# Patient Record
Sex: Male | Born: 1965 | Race: Black or African American | Hispanic: No | Marital: Single | Smoking: Never smoker
Health system: Southern US, Community
[De-identification: ages and names within clinical notes are randomized; demographics above are authoritative.]

## PROBLEM LIST (undated history)

## (undated) DIAGNOSIS — I10 Essential (primary) hypertension: Secondary | ICD-10-CM

## (undated) DIAGNOSIS — G4730 Sleep apnea, unspecified: Secondary | ICD-10-CM

## (undated) DIAGNOSIS — J189 Pneumonia, unspecified organism: Secondary | ICD-10-CM

## (undated) DIAGNOSIS — J9801 Acute bronchospasm: Secondary | ICD-10-CM

---

## 2017-02-20 ENCOUNTER — Emergency Department (HOSPITAL_COMMUNITY)
Admission: EM | Admit: 2017-02-20 | Discharge: 2017-02-21 | Disposition: A | Discharge status: Home / Self Care | Payer: 59 | Attending: Emergency Medicine | Admitting: Emergency Medicine

## 2017-02-20 ENCOUNTER — Emergency Department (HOSPITAL_COMMUNITY): Payer: 59

## 2017-02-20 ENCOUNTER — Encounter (HOSPITAL_COMMUNITY): Payer: Self-pay | Admitting: Emergency Medicine

## 2017-02-20 DIAGNOSIS — J209 Acute bronchitis, unspecified: Secondary | ICD-10-CM | POA: Diagnosis not present

## 2017-02-20 DIAGNOSIS — R05 Cough: Secondary | ICD-10-CM | POA: Diagnosis present

## 2017-02-20 HISTORY — DX: Pneumonia, unspecified organism: J18.9

## 2017-02-20 MED ORDER — IPRATROPIUM-ALBUTEROL 0.5-2.5 (3) MG/3ML IN SOLN
3.0000 mL | Freq: Once | RESPIRATORY_TRACT | Status: AC
  Administered 2017-02-20: 3 mL via RESPIRATORY_TRACT
  Filled 2017-02-20: qty 3

## 2017-02-20 MED ORDER — ALBUTEROL SULFATE (2.5 MG/3ML) 0.083% IN NEBU
2.5000 mg | INHALATION_SOLUTION | Freq: Once | RESPIRATORY_TRACT | Status: AC
  Administered 2017-02-20: 2.5 mg via RESPIRATORY_TRACT
  Filled 2017-02-20: qty 3

## 2017-02-20 MED ORDER — HYDROCOD POLST-CPM POLST ER 10-8 MG/5ML PO SUER
5.0000 mL | Freq: Once | ORAL | Status: AC
  Administered 2017-02-20: 5 mL via ORAL
  Filled 2017-02-20: qty 5

## 2017-02-20 MED ORDER — DEXAMETHASONE SODIUM PHOSPHATE 4 MG/ML IJ SOLN
8.0000 mg | Freq: Once | INTRAMUSCULAR | Status: AC
  Administered 2017-02-20: 8 mg via INTRAMUSCULAR
  Filled 2017-02-20: qty 2

## 2017-02-20 MED ORDER — ALBUTEROL SULFATE HFA 108 (90 BASE) MCG/ACT IN AERS
2.0000 | INHALATION_SPRAY | Freq: Once | RESPIRATORY_TRACT | Status: AC
  Administered 2017-02-20: 2 via RESPIRATORY_TRACT
  Filled 2017-02-20: qty 6.7

## 2017-02-20 NOTE — ED Triage Notes (Signed)
Pt has had a cough for a couple weeks with no relief from otc medication/ he gets sob when cough

## 2017-02-20 NOTE — ED Provider Notes (Signed)
[facility]-EMERGENCY DEPT Provider Note   CSN: [id number] Arrival date & time: 02/20/17  2107     History   Chief Complaint Chief Complaint  Patient presents with  . Cough    HPI Jeffery Cabrera is a 51 y.o. male.  Patient is a 51 year old male who presents to the emergency department with a complaint of cough and congestion.  The patient states that he has had a cough for couple of weeks. This problem is getting progressively worse. He now has interference with attempting to speak, and it interferes with activities of daily living. The cough is mostly dry. The patient states that he has had a few chills, but has not measured a temperature at home over this two-week period. He's not had any recent injury or trauma to the chest. He has not been exposed to noxious fumes that he is aware of. The patient is concerned because he states that the last time he felt this he had pneumonia. He presents now for evaluation of this issue.      Past Medical History:  Diagnosis Date  . Pneumonia     There are no active problems to display for this patient.   History reviewed. No pertinent surgical history.     Home Medications    Prior to Admission medications   Not on File    Family History History reviewed. No pertinent family history.  Social History Social History  Substance Use Topics  . Smoking status: Never Smoker  . Smokeless tobacco: Never Used  . Alcohol use No     Allergies   Patient has no known allergies.   Review of Systems Review of Systems  Constitutional: Positive for chills.  Respiratory: Positive for cough, shortness of breath and wheezing.   All other systems reviewed and are negative.    Physical Exam Updated Vital Signs BP 130/82 (BP Location: Right Arm)   Pulse 81   Temp 99.6 F (37.6 C) (Oral)   Resp 20   Ht  (1.854 m)   Wt 90.7 kg   SpO2 98%   BMI 26.39 kg/m   Physical Exam  Constitutional: He is oriented to person,  place, and time. He appears well-developed and well-nourished.  Non-toxic appearance.  HENT:  Head: Normocephalic.  Right Ear: Tympanic membrane and external ear normal.  Left Ear: Tympanic membrane and external ear normal.  Eyes: EOM and lids are normal. Pupils are equal, round, and reactive to light.  Neck: Normal range of motion. Neck supple. Carotid bruit is not present.  Cardiovascular: Normal rate, regular rhythm, normal heart sounds, intact distal pulses and normal pulses.   Pulmonary/Chest: No respiratory distress. He has wheezes. He has rhonchi.  Cough with attempted deep breathing. Difficulty completing sentences.  Abdominal: Soft. Bowel sounds are normal. There is no tenderness. There is no guarding.  Musculoskeletal: Normal range of motion.  No lower extremity edema. Neg Homan's sign.  Lymphadenopathy:       Head (right side): No submandibular adenopathy present.       Head (left side): No submandibular adenopathy present.    He has no cervical adenopathy.  Neurological: He is alert and oriented to person, place, and time. He has normal strength. No cranial nerve deficit or sensory deficit.  Skin: Skin is warm and dry.  Psychiatric: He has a normal mood and affect. His speech is normal.  Nursing note and vitals reviewed.    ED Treatments / Results  Labs (all labs ordered are listed,  but only abnormal results are displayed) Labs Reviewed - No data to display  EKG  EKG Interpretation None       Radiology No results found.  Procedures Procedures (including critical care time)  Medications Ordered in ED Medications  dexamethasone (DECADRON) injection 8 mg (not administered)  ipratropium-albuterol (DUONEB) 0.5-2.5 (3) MG/3ML nebulizer solution 3 mL (3 mLs Nebulization Given 02/20/17 2208)  albuterol (PROVENTIL HFA;VENTOLIN HFA) 108 (90 Base) MCG/ACT inhaler 2 puff (2 puffs Inhalation Provided for home use 02/20/17 2216)     Initial Impression / Assessment and  Plan / ED Course  I have reviewed the triage vital signs and the nursing notes.  Pertinent labs & imaging results that were available during my care of the patient were reviewed by me and considered in my medical decision making (see chart for details).       Final Clinical Impressions(s) / ED Diagnoses MDM Vital signs reviewed. Pulse oximetry has been 96-98% on room air during the emergency department visit. Patient initially had difficulty completing sentences because of cough. After nebulizer treatments and cough medication he seems to be able to speak mostly in complete sentences. The chest x-ray is negative for acute problem. The d-dimer is negative for acute event.  The patient will be treated for acute bronchitis. I've encouraged him to use his inhaler 2 puffs every 4 hours. He will use Decadron and doxycycline 2 times daily with food. He will use Hycodan for cough if needed. Patient to follow-up with his primary physician or return to the department if not improving. Patient is in agreement with this plan.    Final diagnoses:  Acute bronchitis, unspecified organism    New Prescriptions New Prescriptions   No medications on file     Ivery Quale, PA-C 02/21/17 0054    Bethann Berkshire, MD 02/22/17 [phone]

## 2017-02-21 LAB — D-DIMER, QUANTITATIVE (NOT AT ARMC)

## 2017-02-21 MED ORDER — DEXAMETHASONE 4 MG PO TABS: 4.0000 mg | ORAL_TABLET | Freq: Two times a day (BID) | ORAL | 0 refills | Status: DC

## 2017-02-21 MED ORDER — DOXYCYCLINE HYCLATE 100 MG PO CAPS: 100.0000 mg | ORAL_CAPSULE | Freq: Two times a day (BID) | ORAL | 0 refills | Status: DC

## 2017-02-21 MED ORDER — HYDROCODONE-HOMATROPINE 5-1.5 MG/5ML PO SYRP: 5.0000 mL | ORAL_SOLUTION | Freq: Four times a day (QID) | ORAL | 0 refills | Status: DC | PRN

## 2017-02-21 NOTE — Discharge Instructions (Signed)
Your oxygen level is 96% on room air. You have a mild change in your temperature 99.6. Your chest x-ray is negative for acute problem. A blood tests for blood clots is negative. I suspect that you have an acute bronchitis. Please use Decadron and doxycycline 2 times daily with food. Please use 2 puffs of your albuterol every 4 hours, and please use Hycodan for cough.This medication may cause drowsiness. Please do not drink, drive, or participate in activity that requires concentration while taking this medication. Please see your primary physician, or return to the emergency department if not improving.

## 2020-03-18 ENCOUNTER — Emergency Department (HOSPITAL_COMMUNITY)
Admission: EM | Admit: 2020-03-18 | Discharge: 2020-03-19 | Disposition: A | Discharge status: Home / Self Care | Payer: 59 | Attending: Emergency Medicine | Admitting: Emergency Medicine

## 2020-03-18 ENCOUNTER — Other Ambulatory Visit: Payer: Self-pay

## 2020-03-18 ENCOUNTER — Emergency Department (HOSPITAL_COMMUNITY): Payer: 59

## 2020-03-18 ENCOUNTER — Encounter (HOSPITAL_COMMUNITY): Payer: Self-pay | Admitting: *Deleted

## 2020-03-18 DIAGNOSIS — R0602 Shortness of breath: Secondary | ICD-10-CM | POA: Diagnosis present

## 2020-03-18 DIAGNOSIS — J209 Acute bronchitis, unspecified: Secondary | ICD-10-CM | POA: Insufficient documentation

## 2020-03-18 DIAGNOSIS — R030 Elevated blood-pressure reading, without diagnosis of hypertension: Secondary | ICD-10-CM | POA: Diagnosis not present

## 2020-03-18 NOTE — ED Triage Notes (Signed)
Pt c/o sob that has been getting worse over the past few weeks, pt states that he has problems when the season changes with sob, was seen last year for same and given inhaler.

## 2020-03-19 MED ORDER — PREDNISONE 50 MG PO TABS: 50.0000 mg | ORAL_TABLET | Freq: Every day | ORAL | 0 refills | Status: DC

## 2020-03-19 MED ORDER — ALBUTEROL SULFATE HFA 108 (90 BASE) MCG/ACT IN AERS
4.0000 | INHALATION_SPRAY | Freq: Once | RESPIRATORY_TRACT | Status: AC
  Administered 2020-03-19: 4 via RESPIRATORY_TRACT

## 2020-03-19 MED ORDER — ALBUTEROL SULFATE HFA 108 (90 BASE) MCG/ACT IN AERS
4.0000 | INHALATION_SPRAY | Freq: Once | RESPIRATORY_TRACT | Status: AC
  Administered 2020-03-19: 4 via RESPIRATORY_TRACT
  Filled 2020-03-19: qty 6.7

## 2020-03-19 MED ORDER — PREDNISONE 50 MG PO TABS
60.0000 mg | ORAL_TABLET | Freq: Once | ORAL | Status: AC
  Administered 2020-03-19: 60 mg via ORAL
  Filled 2020-03-19: qty 1

## 2020-03-19 MED ORDER — HYDROCODONE-ACETAMINOPHEN 5-325 MG PO TABS: 1.0000 | ORAL_TABLET | ORAL | 0 refills | Status: DC | PRN

## 2020-03-19 MED ORDER — PREDNISONE 50 MG PO TABS: 60.0000 mg | ORAL_TABLET | Freq: Once | ORAL | Status: DC

## 2020-03-19 NOTE — Discharge Instructions (Signed)
Use the inhaler 2 puffs, every 4 hours, as needed for cough or shortness of breath.  You may take a hydrocodone-acetaminophen tablet at bedtime to help suppress cough at night.  Return if symptoms are getting worse.  Your blood pressure was slightly high today.  Please have it rechecked several times over the next 1-2 weeks.  If it is persistently high, you might need to be on medication to control it.

## 2020-03-19 NOTE — ED Provider Notes (Signed)
[facility] EMERGENCY DEPARTMENT Provider Note   CSN: [id number] Arrival date & time: 03/18/20  2141   History Chief Complaint  Patient presents with  . Shortness of Breath    Jeffery Cabrera is a 54 y.o. male.  The history is provided by the patient.  Shortness of Breath He comes in with a 1 week history of shortness of breath and a nonproductive cough.  He had similar symptoms in the past, and was given an inhaler.  He has used the inhaler with slight relief.  He denies fever, chills, sweats.  He denies nausea or vomiting.  He denies arthralgias or myalgias.  There is been no change in sense of smell or taste.  He denies exposure to COVID-19, and has received the COVID-19 vaccination series.  He is a non-smoker.  Past Medical History:  Diagnosis Date  . Pneumonia     There are no problems to display for this patient.   History reviewed. No pertinent surgical history.     No family history on file.  Social History   Tobacco Use  . Smoking status: Never Smoker  . Smokeless tobacco: Never Used  Substance Use Topics  . Alcohol use: No  . Drug use: No    Home Medications Prior to Admission medications   Medication Sig Start Date End Date Taking? Authorizing Provider  dexamethasone (DECADRON) 4 MG tablet Take 1 tablet (4 mg total) by mouth 2 (two) times daily with a meal. 02/21/17   Lily Kocher, PA-C  doxycycline (VIBRAMYCIN) 100 MG capsule Take 1 capsule (100 mg total) by mouth 2 (two) times daily. 02/21/17   Lily Kocher, PA-C  HYDROcodone-homatropine [facility]) 5-1.5 MG/5ML syrup Take 5 mLs by mouth every 6 (six) hours as needed. 02/21/17   Lily Kocher, PA-C    Allergies    Patient has no known allergies.  Review of Systems   Review of Systems  Respiratory: Positive for shortness of breath.   All other systems reviewed and are negative.   Physical Exam Updated Vital Signs BP (!) 145/109   Pulse (!) 106   Temp 99.1 F (37.3 C) (Oral)   Resp 20    Ht 6\' 2"  (1.88 m)   Wt 108.9 kg   SpO2 95%   BMI 30.81 kg/m   Physical Exam Vitals and nursing note reviewed.   54 year old male, resting comfortably and in no acute distress. Vital signs are significant for mild elevation of heart rate and elevated blood pressure. Oxygen saturation is 95%, which is normal. Head is normocephalic and atraumatic. PERRLA, EOMI. Oropharynx is clear. Neck is nontender and supple without adenopathy or JVD. Back is nontender and there is no CVA tenderness. Lungs have faint expiratory wheezes without rales or rhonchi. Chest is nontender. Heart has regular rate and rhythm without murmur. Abdomen is soft, flat, nontender without masses or hepatosplenomegaly and peristalsis is normoactive. Extremities have no cyanosis or edema, full range of motion is present. Skin is warm and dry without rash. Neurologic: Mental status is normal, cranial nerves are intact, there are no motor or sensory deficits.  ED Results / Procedures / Treatments    EKG EKG Interpretation  Date/Time:  Friday Mar 18 2020 22:16:18 EDT Ventricular Rate:  101 PR Interval:    QRS Duration: 86 QT Interval:  338 QTC Calculation: 439 R Axis:   29 Text Interpretation: Sinus tachycardia Otherwise within normal limits No old tracing to compare Confirmed by Delora Fuel ([id number]) on 03/19/2020 12:36:25 AM  Radiology DG Chest 2 View  Result Date: 03/18/2020 CLINICAL DATA:  Worsening shortness of breath. EXAM: CHEST - 2 VIEW COMPARISON:  February 20, 2017 FINDINGS: The heart size and mediastinal contours are within normal limits. Both lungs are clear. The visualized skeletal structures are unremarkable. IMPRESSION: No active cardiopulmonary disease. Electronically Signed   By: Aram Candela M.D.   On: 03/18/2020 22:37    Procedures Procedures   Medications Ordered in ED Medications  albuterol (VENTOLIN HFA) 108 (90 Base) MCG/ACT inhaler 4 puff (has no administration in time range)    predniSONE (DELTASONE) tablet 60 mg (has no administration in time range)    ED Course  I have reviewed the triage vital signs and the nursing notes.  Pertinent imaging results that were available during my care of the patient were reviewed by me and considered in my medical decision making (see chart for details).  MDM Rules/Calculators/A&P Cough and dyspnea consistent with bronchitis.  Chest x-ray shows no evidence of pneumonia.  Old records are reviewed confirming ED visit April 2018 with diagnosis of bronchitis treated with doxycycline, dexamethasone, albuterol.  Will give albuterol and prednisone.  1:35 AM There was slight improvement following albuterol.  He is given an additional albuterol.  2:12 AM There was significant improvement after an additional albuterol.  However, he still will cough if he takes a deep breath.  He is discharged with prescription for prednisone and is advised to continue using the inhaler as needed.  Also given a take-home pack of hydrocodone-acetaminophen to suppress the cough at night if he is having difficulty sleeping. Recommended recheck blood pressure as an outpatient.  Final Clinical Impression(s) / ED Diagnoses Final diagnoses:  Acute bronchitis, unspecified organism  Elevated blood pressure reading without diagnosis of hypertension    Rx / DC Orders ED Discharge Orders         Ordered    predniSONE (DELTASONE) 50 MG tablet  Daily     03/19/20 0210    HYDROcodone-acetaminophen (NORCO) 5-325 MG tablet  Every 4 hours PRN     03/19/20 0210           Dione Booze, MD 03/19/20 [phone]

## 2020-03-21 MED FILL — Hydrocodone-Acetaminophen Tab 5-325 MG: ORAL | Qty: 6 | Status: AC

## 2020-03-30 ENCOUNTER — Other Ambulatory Visit: Payer: Self-pay

## 2020-03-30 ENCOUNTER — Emergency Department (HOSPITAL_COMMUNITY): Payer: 59

## 2020-03-30 ENCOUNTER — Encounter (HOSPITAL_COMMUNITY): Payer: Self-pay | Admitting: Emergency Medicine

## 2020-03-30 ENCOUNTER — Emergency Department (HOSPITAL_COMMUNITY)
Admission: EM | Admit: 2020-03-30 | Discharge: 2020-03-31 | Disposition: A | Discharge status: Home / Self Care | Payer: 59 | Attending: Emergency Medicine | Admitting: Emergency Medicine

## 2020-03-30 DIAGNOSIS — R0602 Shortness of breath: Secondary | ICD-10-CM

## 2020-03-30 DIAGNOSIS — Z20822 Contact with and (suspected) exposure to covid-19: Secondary | ICD-10-CM | POA: Insufficient documentation

## 2020-03-30 DIAGNOSIS — J9801 Acute bronchospasm: Secondary | ICD-10-CM | POA: Diagnosis not present

## 2020-03-30 DIAGNOSIS — R059 Cough, unspecified: Secondary | ICD-10-CM

## 2020-03-30 NOTE — ED Triage Notes (Signed)
Pt c/o sob that began a month ago. Pt was seen here last week and diagnosed with bronchitis, pt was given meds and pt states he has taken them like prescribed and hasnt had any relief.

## 2020-03-31 LAB — CBC WITH DIFFERENTIAL/PLATELET
Abs Immature Granulocytes: 0.05 10*3/uL (ref 0.00–0.07)
Basophils Absolute: 0 10*3/uL (ref 0.0–0.1)
Basophils Relative: 0 %
Eosinophils Absolute: 0.3 10*3/uL (ref 0.0–0.5)
Eosinophils Relative: 3 %
HCT: 43.5 % (ref 39.0–52.0)
Hemoglobin: 14.2 g/dL (ref 13.0–17.0)
Immature Granulocytes: 1 %
Lymphocytes Relative: 35 %
Lymphs Abs: 3.4 10*3/uL (ref 0.7–4.0)
MCH: 34.4 pg — ABNORMAL HIGH (ref 26.0–34.0)
MCHC: 32.6 g/dL (ref 30.0–36.0)
MCV: 105.3 fL — ABNORMAL HIGH (ref 80.0–100.0)
Monocytes Absolute: 1.1 10*3/uL — ABNORMAL HIGH (ref 0.1–1.0)
Monocytes Relative: 12 %
Neutro Abs: 4.7 10*3/uL (ref 1.7–7.7)
Neutrophils Relative %: 49 %
Platelets: 268 10*3/uL (ref 150–400)
RBC: 4.13 MIL/uL — ABNORMAL LOW (ref 4.22–5.81)
RDW: 12.5 % (ref 11.5–15.5)
WBC: 9.6 10*3/uL (ref 4.0–10.5)
nRBC: 0 % (ref 0.0–0.2)

## 2020-03-31 LAB — BRAIN NATRIURETIC PEPTIDE: B Natriuretic Peptide: 21 pg/mL (ref 0.0–100.0)

## 2020-03-31 LAB — POC SARS CORONAVIRUS 2 AG -  ED: SARS Coronavirus 2 Ag: NEGATIVE

## 2020-03-31 LAB — BASIC METABOLIC PANEL
Anion gap: 10 (ref 5–15)
BUN: 14 mg/dL (ref 6–20)
CO2: 28 mmol/L (ref 22–32)
Calcium: 8.9 mg/dL (ref 8.9–10.3)
Chloride: 103 mmol/L (ref 98–111)
Creatinine, Ser: 1.27 mg/dL — ABNORMAL HIGH (ref 0.61–1.24)
GFR calc Af Amer: >60 mL/min (ref >60)
GFR calc non Af Amer: >60 mL/min (ref >60)
Glucose, Bld: 108 mg/dL — ABNORMAL HIGH (ref 70–99)
Potassium: 3.6 mmol/L (ref 3.5–5.1)
Sodium: 141 mmol/L (ref 135–145)

## 2020-03-31 LAB — D-DIMER, QUANTITATIVE: D-Dimer, Quant: 0.28 ug/mL-FEU (ref 0.00–0.50)

## 2020-03-31 MED ORDER — DEXAMETHASONE 4 MG PO TABS
10.0000 mg | ORAL_TABLET | Freq: Once | ORAL | Status: AC
  Administered 2020-03-31: 10 mg via ORAL
  Filled 2020-03-31: qty 3

## 2020-03-31 MED ORDER — PREDNISONE 50 MG PO TABS
ORAL_TABLET | ORAL | 0 refills | Status: DC
Start: 2020-03-31 — End: 2020-05-30

## 2020-03-31 MED ORDER — ALBUTEROL SULFATE (2.5 MG/3ML) 0.083% IN NEBU
INHALATION_SOLUTION | RESPIRATORY_TRACT | Status: AC
  Filled 2020-03-31: qty 3

## 2020-03-31 MED ORDER — IPRATROPIUM BROMIDE 0.02 % IN SOLN
0.5000 mg | Freq: Once | RESPIRATORY_TRACT | Status: AC
  Administered 2020-03-31: 0.5 mg via RESPIRATORY_TRACT
  Filled 2020-03-31: qty 2.5

## 2020-03-31 MED ORDER — ALBUTEROL (5 MG/ML) CONTINUOUS INHALATION SOLN
10.0000 mg/h | INHALATION_SOLUTION | Freq: Once | RESPIRATORY_TRACT | Status: AC
  Administered 2020-03-31: 10 mg/h via RESPIRATORY_TRACT
  Filled 2020-03-31: qty 20

## 2020-03-31 MED ORDER — ALBUTEROL SULFATE (2.5 MG/3ML) 0.083% IN NEBU
5.0000 mg | INHALATION_SOLUTION | Freq: Once | RESPIRATORY_TRACT | Status: AC
  Administered 2020-03-31: 5 mg via RESPIRATORY_TRACT
  Filled 2020-03-31: qty 6

## 2020-03-31 NOTE — ED Notes (Signed)
Pt ambulated once around nurses' station. O2 sats were 94% prior to ambulation and dropped to 93% during ambulation. Pt stated he was winded with ambulation and unable to speak in a full sentence.

## 2020-03-31 NOTE — ED Provider Notes (Signed)
[facility] EMERGENCY DEPARTMENT Provider Note   CSN: [id number] Arrival date & time: 03/30/20  2008     History Chief Complaint  Patient presents with  . Shortness of Breath    Jeffery Cabrera is a 54 y.o. male.  The history is provided by the patient.  Shortness of Breath Severity:  Moderate Onset quality:  Gradual Duration:  1 month Timing:  Intermittent Progression:  Worsening Chronicity:  New Relieved by:  Nothing Worsened by:  Nothing Associated symptoms: cough and wheezing   Associated symptoms: no abdominal pain, no chest pain, no diaphoresis, no fever, no hemoptysis and no vomiting   Risk factors: no tobacco use   Patient is an otherwise healthy 54 year old who presents with cough and shortness of breath.  He reports this is been ongoing for up to a month.  He reports frequent episodes of dry cough particularly when he speaks.  No hemoptysis.  No active chest pain.  No fevers or night sweats.  No weight loss.  No dyspnea on exertion.  No lower extremity edema. Seen recently for similar symptoms with no significant improvement. No history of CAD/VTE     Past Medical History:  Diagnosis Date  . Pneumonia     There are no problems to display for this patient.   History reviewed. No pertinent surgical history.     History reviewed. No pertinent family history.  Social History   Tobacco Use  . Smoking status: Never Smoker  . Smokeless tobacco: Never Used  Substance Use Topics  . Alcohol use: No  . Drug use: No    Home Medications Prior to Admission medications   Not on File    Allergies    Patient has no known allergies.  Review of Systems   Review of Systems  Constitutional: Negative for diaphoresis, fever and unexpected weight change.  HENT: Negative for postnasal drip.   Respiratory: Positive for cough, shortness of breath and wheezing. Negative for hemoptysis.   Cardiovascular: Negative for chest pain and leg swelling.    Gastrointestinal: Negative for abdominal pain and vomiting.  All other systems reviewed and are negative.   Physical Exam Updated Vital Signs BP (!) 138/98   Pulse 89   Temp 98.9 F (37.2 C) (Oral)   Resp 18   Ht 1.829 m (6')   Wt 108.9 kg   SpO2 96%   BMI 32.55 kg/m   Physical Exam CONSTITUTIONAL: Well developed/well nourished HEAD: Normocephalic/atraumatic EYES: EOMI/PERRL ENMT: Mucous membranes moist, uvula midline without erythema or exudates. NECK: supple no meningeal signs SPINE/BACK:entire spine nontender CV: S1/S2 noted, no murmurs/rubs/gallops noted LUNGS: Scattered wheezing bilaterally, coughs frequent during exam, no apparent distress ABDOMEN: soft, nontender, no rebound or guarding, bowel sounds noted throughout abdomen GU:no cva tenderness NEURO: Pt is awake/alert/appropriate, moves all extremitiesx4.  No facial droop.  EXTREMITIES: pulses normal/equal, full ROM, no calf tenderness or edema SKIN: warm, color normal PSYCH: no abnormalities of mood noted, alert and oriented to situation  ED Results / Procedures / Treatments   Labs (all labs ordered are listed, but only abnormal results are displayed) Labs Reviewed  BASIC METABOLIC PANEL - Abnormal; Notable for the following components:      Result Value   Glucose, Bld 108 (*)    Creatinine, Ser 1.27 (*)    All other components within normal limits  CBC WITH DIFFERENTIAL/PLATELET - Abnormal; Notable for the following components:   RBC 4.13 (*)    MCV 105.3 (*)    Vital Sight Pc  34.4 (*)    Monocytes Absolute 1.1 (*)    All other components within normal limits  BRAIN NATRIURETIC PEPTIDE  D-DIMER, QUANTITATIVE (NOT AT [facility])  POC SARS CORONAVIRUS 2 AG -  ED    EKG EKG Interpretation  Date/Time:  Wednesday March 30 2020 20:25:18 EDT Ventricular Rate:  102 PR Interval:  138 QRS Duration: 80 QT Interval:  326 QTC Calculation: 424 R Axis:   56 Text Interpretation: Sinus tachycardia with Premature atrial  complexes Nonspecific T wave abnormality Abnormal ECG No significant change since last tracing Confirmed by Ripley Fraise [phone]) on 03/30/2020 11:51:51 PM   Radiology DG Chest Port 1 View  Result Date: 03/30/2020 CLINICAL DATA:  Shortness of breath for 1 month EXAM: PORTABLE CHEST 1 VIEW COMPARISON:  03/18/2020 FINDINGS: Cardiac shadow is within normal limits. The lungs are well aerated bilaterally. No focal infiltrate or sizable effusion is seen. No bony abnormality is noted. IMPRESSION: No active disease. Electronically Signed   By: Inez Catalina M.D.   On: 03/30/2020 23:33    Procedures .Critical Care Performed by: Ripley Fraise, MD Authorized by: Ripley Fraise, MD   Critical care provider statement:    Critical care time (minutes):  50   Critical care start time:  03/31/2020 12:10 AM   Critical care end time:  03/31/2020 1:00 AM   Critical care time was exclusive of:  Separately billable procedures and treating other patients   Critical care was necessary to Cabrera or prevent imminent or life-threatening deterioration of the following conditions:  Respiratory failure   Critical care was time spent personally by me on the following activities:  Ordering and performing treatments and interventions, ordering and review of laboratory studies, ordering and review of radiographic studies, pulse oximetry, re-evaluation of patient's condition, evaluation of patient's response to treatment, examination of patient and development of treatment plan with patient or surrogate   I assumed direction of critical care for this patient from another provider in my specialty: no        Medications Ordered in ED Medications  dexamethasone (DECADRON) tablet 10 mg (10 mg Oral Given 03/31/20 0015)  albuterol (PROVENTIL) (2.5 MG/3ML) 0.083% nebulizer solution 5 mg ( Nebulization Not Given 03/31/20 0120)  ipratropium (ATROVENT) nebulizer solution 0.5 mg (0.5 mg Nebulization Given 03/31/20 0102)  albuterol  (PROVENTIL,VENTOLIN) solution continuous neb (10 mg/hr Nebulization Given 03/31/20 0220)    ED Course  I have reviewed the triage vital signs and the nursing notes.  Pertinent labs & imaging results that were available during my care of the patient were reviewed by me and considered in my medical decision making (see chart for details).    MDM Rules/Calculators/A&P                        This patient presents to the ED for concern of cough and shortness of breath, this involves an extensive number of treatment options, and is a complaint that carries with it a high risk of complications and morbidity.  The differential diagnosis includes pneumonia, COVID-19, congestive heart failure, pulmonary embolism, acute coronary syndrome   Lab Tests:   I Ordered, reviewed, and interpreted labs, which included D-dimer, brain natretic peptide, metabolic panel, complete blood count  Medicines ordered:   I ordered medication albuterol, Atrovent, Decadron for wheezing  Imaging Studies ordered:   I ordered imaging studies which included chest x-ray   I independently visualized and interpreted imaging which showed no acute findings  Additional history  obtained:    Previous records obtained and reviewed   Reevaluation:  After the interventions stated above, I reevaluated the patient and found patient is improved  Critical Interventions:  . Hour-long albuterol nebulized therapy  3:32 AM Patient presented with cough and wheezing have been ongoing for quite some time.  He reports recent acute worsening.  He is unsure what is causing this as he is a non-smoker and has never had it before.  He suspects it may be occupational related.  He had wheezing on initial examination and was tachypneic on ambulation.  He was given multiple treatments including hour-long nebulized therapy.  Overall patient is improved.  Room air pulse ox is 97%.  His vitals are otherwise appropriate. BP 119/63 (BP  Location: Right Arm)   Pulse 98   Temp 98.9 F (37.2 C) (Oral)   Resp 18   Ht 1.829 m (6')   Wt 108.9 kg   SpO2 97%   BMI 32.55 kg/m  Extensive evaluation emergency department has ruled out a PE, no signs of CHF, no signs of pneumonia. Overall I feel he is appropriate for discharge home.  He will be placed back on a short burst of steroids.  He will be referred to a primary care physician as well as pulmonology  Jeffery Cabrera was evaluated in Emergency Department on 03/31/2020 for the symptoms described in the history of present illness. He was evaluated in the context of the global COVID-19 pandemic, which necessitated consideration that the patient might be at risk for infection with the SARS-CoV-2 virus that causes COVID-19. Institutional protocols and algorithms that pertain to the evaluation of patients at risk for COVID-19 are in a state of rapid change based on information released by regulatory bodies including the CDC and federal and state organizations. These policies and algorithms were followed during the patient's care in the ED.  Final Clinical Impression(s) / ED Diagnoses Final diagnoses:  Cough  Acute bronchospasm    Rx / DC Orders ED Discharge Orders         Ordered    predniSONE (DELTASONE) 50 MG tablet     03/31/20 0310           Zadie Rhine, MD 03/31/20 [phone]

## 2020-05-30 ENCOUNTER — Ambulatory Visit (INDEPENDENT_AMBULATORY_CARE_PROVIDER_SITE_OTHER): Payer: 59 | Admitting: Pulmonary Disease

## 2020-05-30 ENCOUNTER — Other Ambulatory Visit: Payer: Self-pay

## 2020-05-30 ENCOUNTER — Encounter: Payer: Self-pay | Admitting: Pulmonary Disease

## 2020-05-30 VITALS — BP 140/94 | HR 80 | Temp 98.4°F | Ht 74.0 in | Wt 234.4 lb

## 2020-05-30 DIAGNOSIS — J4520 Mild intermittent asthma, uncomplicated: Secondary | ICD-10-CM

## 2020-05-30 DIAGNOSIS — J45909 Unspecified asthma, uncomplicated: Secondary | ICD-10-CM | POA: Insufficient documentation

## 2020-05-30 MED ORDER — ALBUTEROL SULFATE HFA 108 (90 BASE) MCG/ACT IN AERS
2.0000 | INHALATION_SPRAY | Freq: Four times a day (QID) | RESPIRATORY_TRACT | 3 refills | Status: DC | PRN
Start: 2020-05-30 — End: 2022-09-24

## 2020-05-30 MED ORDER — BREO ELLIPTA 100-25 MCG/INH IN AEPB
1.0000 | INHALATION_SPRAY | Freq: Every day | RESPIRATORY_TRACT | 0 refills | Status: DC
Start: 2020-05-30 — End: 2022-09-24

## 2020-05-30 NOTE — Patient Instructions (Signed)
Your intermittent wheezing may be related to asthma or reflux.  Schedule spirometry-pre and post.  Trial of Breo 100 -use once daily for 3 to 4 days if you start having symptoms, rinse mouth after use Use albuterol 2 puffs every 6 hours as needed when you have wheezing  If you have another flare, call us and we will schedule allergy testing and start treatment for reflux

## 2020-05-30 NOTE — Assessment & Plan Note (Signed)
intermittent wheezing may be related to RADS or reflux.  He does have some seasonal variation so could be allergy related although he does not seem to have the typical eyes or nasal symptoms Does not seem to be exercise-induced since he can perform a full workout including cardio without any problems when he is well.  Detailed environmental history does not elicit any obvious trigger  Schedule spirometry-pre and post.  Trial of Breo 100 -use once daily for 3 to 4 days if you start having symptoms, rinse mouth after use (Symbicort would be ideal for such as needed use but we only have samples of Breo today ) Use albuterol 2 puffs every 6 hours as needed when you have wheezing  If you have another flare, call us and we will pursue RAST/allergy testing and start empiric treatment for reflux

## 2020-05-30 NOTE — Progress Notes (Signed)
Subjective:    Patient ID: Jeffery Cabrera, male    DOB: 20-Feb-1966, 54 y.o.   MRN: [id number]  HPI  Chief Complaint  Patient presents with  . Consult    patient has had a dry cough over the last couple months notices it more when he is talking. He feels like he has to clear his throat all the time.  Was seen in the ED on 6/2 for the same   54 year old never smoker presents for evaluation of intermittent wheezing and bronchospasm. 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  He reports seasonal issues in the spring and fall when his throat would be affected and he would lose his voice although did not have any typical eye or nasal symptoms.  Has never taken allergy medications.  He went to an urgent care few years ago and was given an albuterol inhaler  He had an ED visit 5/21 where he was treated for "bronchitis" with nebulizer and given prednisone taper and hydrocodone tablets for cough.  He had another visit on 6/2 where he required a continuous nebulizer with albuterol and another Atrovent neb and was given dexamethasone 10 mg and discharged with another prednisone taper.  Chest x-ray on both these occasions which I personally reviewed did not show infiltrates or effusions, Covid testing was negative  Since then he has been well.  He works out daily and is able to [employer] and do cardio without any problems.  He denies significant reflux and does not need over-the-counter medication for this.  He denies nasal stuffiness or itchy eyes  Labs reviewed normal CBC, AEC 300, creatinine 1.2    Past Medical History:  Diagnosis Date  . Pneumonia    History reviewed. No pertinent surgical history.  No Known Allergies  Social History   Socioeconomic History  . Marital status: Married    Spouse name: Not on file  . Number of children: Not on file  . Years of education: Not on file  . Highest education level: Not on file   Occupational History  . Not on file  Tobacco Use  . Smoking status: Never Smoker  . Smokeless tobacco: Never Used  Vaping Use  . Vaping Use: Never used  Substance and Sexual Activity  . Alcohol use: No  . Drug use: No  . Sexual activity: Not on file  Other Topics Concern  . Not on file  Social History Narrative  . Not on file   Social Determinants of Health   Financial Resource Strain:   . Difficulty of Paying Living Expenses:   Food Insecurity:   . Worried About Programme researcher, broadcasting/film/video in the Last Year:   . Barista in the Last Year:   Transportation Needs:   . Freight forwarder (Medical):   Marland Kitchen Lack of Transportation (Non-Medical):   Physical Activity:   . Days of Exercise per Week:   . Minutes of Exercise per Session:   Stress:   . Feeling of Stress :   Social Connections:   . Frequency of Communication with Friends and Family:   . Frequency of Social Gatherings with Friends and Family:   . Attends Religious Services:   . Active Member of Clubs or Organizations:   . Attends Banker Meetings:   Marland Kitchen Marital Status:   Intimate Partner Violence:   . Fear of Current or Ex-Partner:   . Emotionally  Abused:   Marland Kitchen Physically Abused:   . Sexually Abused:      History reviewed. No pertinent family history.    Review of Systems   Constitutional: negative for anorexia, fevers and sweats  Eyes: negative for irritation, redness and visual disturbance  Ears, nose, mouth, throat, and face: negative for earaches, epistaxis, nasal congestion and sore throat  Respiratory: negative for  dyspnea on exertion, sputum Cardiovascular: negative for chest pain, dyspnea, lower extremity edema, orthopnea, palpitations and syncope  Gastrointestinal: negative for abdominal pain, constipation, diarrhea, melena, nausea and vomiting  Genitourinary:negative for dysuria, frequency and hematuria  Hematologic/lymphatic: negative for bleeding, easy bruising and lymphadenopathy   Musculoskeletal:negative for arthralgias, muscle weakness and stiff joints  Neurological: negative for coordination problems, gait problems, headaches and weakness  Endocrine: negative for diabetic symptoms including polydipsia, polyuria and weight loss     Objective:   Physical Exam  Gen. Pleasant, well-nourished, well built in no distress, normal affect ENT - no pallor,icterus, no post nasal drip Neck: No JVD, no thyromegaly, no carotid bruits Lungs: no use of accessory muscles, no dullness to percussion, clear without rales or rhonchi  Cardiovascular: Rhythm regular, heart sounds  normal, no murmurs or gallops, no peripheral edema Abdomen: soft and non-tender, no hepatosplenomegaly, BS normal. Musculoskeletal: No deformities, no cyanosis or clubbing Neuro:  alert, non focal       Assessment & Plan:

## 2020-08-08 ENCOUNTER — Ambulatory Visit (INDEPENDENT_AMBULATORY_CARE_PROVIDER_SITE_OTHER): Payer: 59 | Admitting: Urology

## 2020-08-08 ENCOUNTER — Encounter: Payer: Self-pay | Admitting: Urology

## 2020-08-08 ENCOUNTER — Other Ambulatory Visit: Payer: Self-pay

## 2020-08-08 VITALS — BP 144/88 | HR 98 | Temp 99.0°F | Ht 74.0 in | Wt 234.0 lb

## 2020-08-08 DIAGNOSIS — N486 Induration penis plastica: Secondary | ICD-10-CM | POA: Diagnosis not present

## 2020-08-08 MED ORDER — PENTOXIFYLLINE ER 400 MG PO TBCR: 400.0000 mg | EXTENDED_RELEASE_TABLET | Freq: Two times a day (BID) | ORAL | 2 refills | Status: DC

## 2020-08-08 NOTE — Patient Instructions (Signed)
Collagenase injection (Dupuytren's Contracture/Peyronie's Disease) What is this medicine? COLLAGENASE (kohl LAH jen ace) is used to treat Dupuytren's contracture. This medicine may help straighten a bent finger by breaking up hard tissue. It is also used for Peyronie's disease by breaking up the hard tissue plaque that causes the curvature in the penis. This medicine may be used for other purposes; ask your health care provider or pharmacist if you have questions. COMMON BRAND NAME(S): Xiaflex What should I tell my health care provider before I take this medicine? They need to know if you have any of these conditions:  hemophilia  low platelet counts  take medicines that treat or prevent blood clots  an unusual or allergic reaction to collagenase, other medicines, foods, dyes, or preservatives  pregnant or trying to get pregnant  breast-feeding How should I use this medicine? This medicine is for injection into the hand or penis. It is given by a health care professional in a hospital or clinic setting. A special MedGuide will be given to you by the pharmacist with each prescription and refill. Be sure to read this information carefully each time. Talk to your pediatrician regarding the use of this medicine in children. Special care may be needed. Overdosage: If you think you have taken too much of this medicine contact a poison control center or emergency room at once. NOTE: This medicine is only for you. Do not share this medicine with others. What if I miss a dose? It is important not to miss your dose. Call your doctor or health care professional if you are unable to keep an appointment. What may interact with this medicine?  aspirin and aspirin-like medicines  certain medicines that treat or prevent blood clots like warfarin, enoxaparin, and dalteparin This list may not describe all possible interactions. Give your health care provider a list of all the medicines, herbs,  non-prescription drugs, or dietary supplements you use. Also tell them if you smoke, drink alcohol, or use illegal drugs. Some items may interact with your medicine. What should I watch for while using this medicine? Your condition will be monitored carefully while you are receiving this medicine. If being treated for Dupuytren's contracture, return to your healthcare provider the day after your hand is injected. In the meantime, do not flex or extend the fingers of your hand that was injected. Do not touch your finger that was injected, and elevate your hand until bedtime. Do not perform activity with the injected hand until you are told that it is OK. Follow any instructions about wearing a splint or performing finger exercises. Also, call your healthcare provider if you get increasing redness or swelling in the hand, if you have numbness or tingling in the treated finger, or if you have trouble bending the finger after the swelling goes down. If being treated for Peyronie's disease, you will need to return to your healthcare provider for a manual procedure that will stretch and help straighten your penis. Also, your healthcare provider will show you how to gently stretch your penis at home. Do not resume sexual activity until you are told that it is okay. Follow instructions on when to return for follow-up visits. Immediately call your doctor if you have trouble stretching or straightening your penis, or if you have pain or other concerns. Immediately call your healthcare provider if you get a fever or chills. What side effects may I notice from receiving this medicine? Side effects that you should report to your doctor or health   care professional as soon as possible:  allergic reactions like skin rash, itching or hives, swelling of the face, lips, or tongue  breathing problems  chest pain or palpitations  pain in your penis  pain when urinating  red or dark-brown urine  sudden loss of the  ability to maintain an erection  swelling of the injected hand  unusual swelling or bruising of the penis Side effects that usually do not require medical attention (report to your doctor or health care professional if they continue or are bothersome):  irritation at site where injected  pain at site where injected  unusual bleeding or bruising This list may not describe all possible side effects. Call your doctor for medical advice about side effects. You may report side effects to FDA at 1-800-FDA-1088. Where should I keep my medicine? This drug is given in a hospital or clinic and will not be stored at home. NOTE: This sheet is a summary. It may not cover all possible information. If you have questions about this medicine, talk to your doctor, pharmacist, or health care provider.  2020 Elsevier/Gold Standard (2015-11-17 09:34:13)  

## 2020-08-08 NOTE — Progress Notes (Signed)
Urological Symptom Review  Patient is experiencing the following symptoms: Get up at night to urinate Erection problems (male only) Penile pain (male only)    Review of Systems  Gastrointestinal (upper)  : Negative for upper GI symptoms  Gastrointestinal (lower) : Negative for lower GI symptoms  Constitutional : Negative for symptoms  Skin: Negative for skin symptoms  Eyes: Negative for eye symptoms  Ear/Nose/Throat : Negative for Ear/Nose/Throat symptoms  Hematologic/Lymphatic: Negative for Hematologic/Lymphatic symptoms  Cardiovascular : Negative for cardiovascular symptoms  Respiratory : Negative for respiratory symptoms  Endocrine: Negative for endocrine symptoms  Musculoskeletal: Negative for musculoskeletal symptoms  Neurological: Negative for neurological symptoms  Psychologic: Negative for psychiatric symptoms
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
## 2020-11-07 ENCOUNTER — Ambulatory Visit: Payer: 59 | Admitting: Urology

## 2020-12-26 ENCOUNTER — Encounter: Payer: Self-pay | Admitting: Urology

## 2020-12-26 ENCOUNTER — Ambulatory Visit (INDEPENDENT_AMBULATORY_CARE_PROVIDER_SITE_OTHER): Payer: 59 | Admitting: Urology

## 2020-12-26 ENCOUNTER — Other Ambulatory Visit: Payer: Self-pay

## 2020-12-26 VITALS — BP 159/97 | HR 97 | Temp 98.5°F | Ht 74.0 in | Wt 240.0 lb

## 2020-12-26 DIAGNOSIS — N486 Induration penis plastica: Secondary | ICD-10-CM | POA: Diagnosis not present

## 2020-12-26 LAB — URINALYSIS, ROUTINE W REFLEX MICROSCOPIC
Bilirubin, UA: NEGATIVE
Glucose, UA: NEGATIVE
Leukocytes,UA: NEGATIVE
Nitrite, UA: NEGATIVE
Protein,UA: NEGATIVE
Specific Gravity, UA: 1.025 (ref 1.005–1.030)
Urobilinogen, Ur: 0.2 mg/dL (ref 0.2–1.0)
pH, UA: 5 (ref 5.0–7.5)

## 2020-12-26 LAB — MICROSCOPIC EXAMINATION
Bacteria, UA: NONE SEEN
Epithelial Cells (non renal): NONE SEEN /hpf (ref 0–10)
Renal Epithel, UA: NONE SEEN /hpf
WBC, UA: NONE SEEN /hpf (ref 0–5)

## 2020-12-26 NOTE — Progress Notes (Signed)
Urological Symptom Review  Patient is experiencing the following symptoms: Get up at night to urinate Erection problems (male only)   Review of Systems  Gastrointestinal (upper)  : Negative for upper GI symptoms  Gastrointestinal (lower) : Negative for lower GI symptoms  Constitutional : Negative for symptoms  Skin: Negative for skin symptoms  Eyes: Negative for eye symptoms  Ear/Nose/Throat : Negative for Ear/Nose/Throat symptoms  Hematologic/Lymphatic: Negative for Hematologic/Lymphatic symptoms  Cardiovascular : Negative for cardiovascular symptoms  Respiratory : Negative for respiratory symptoms  Endocrine: Negative for endocrine symptoms  Musculoskeletal: Negative for musculoskeletal symptoms  Neurological: Negative for neurological symptoms  Psychologic: Negative for psychiatric symptoms 

## 2020-12-26 NOTE — Patient Instructions (Signed)
Collagenase injection (Dupuytren's Contracture/Peyronie's Disease) What is this medicine? COLLAGENASE (kohl LAH jen ace) is used to treat Dupuytren's contracture. This medicine may help straighten a bent finger by breaking up hard tissue. It is also used for Peyronie's disease by breaking up the hard tissue plaque that causes the curvature in the penis. This medicine may be used for other purposes; ask your health care provider or pharmacist if you have questions. COMMON BRAND NAME(S): Xiaflex What should I tell my health care provider before I take this medicine? They need to know if you have any of these conditions:  hemophilia  low platelet counts  take medicines that treat or prevent blood clots  an unusual or allergic reaction to collagenase, other medicines, foods, dyes, or preservatives  pregnant or trying to get pregnant  breast-feeding How should I use this medicine? This medicine is for injection into the hand or penis. It is given by a health care professional in a hospital or clinic setting. A special MedGuide will be given to you by the pharmacist with each prescription and refill. Be sure to read this information carefully each time. Talk to your pediatrician regarding the use of this medicine in children. Special care may be needed. Overdosage: If you think you have taken too much of this medicine contact a poison control center or emergency room at once. NOTE: This medicine is only for you. Do not share this medicine with others. What if I miss a dose? It is important not to miss your dose. Call your doctor or health care professional if you are unable to keep an appointment. What may interact with this medicine?  aspirin and aspirin-like medicines  certain medicines that treat or prevent blood clots like warfarin, enoxaparin, and dalteparin This list may not describe all possible interactions. Give your health care provider a list of all the medicines, herbs,  non-prescription drugs, or dietary supplements you use. Also tell them if you smoke, drink alcohol, or use illegal drugs. Some items may interact with your medicine. What should I watch for while using this medicine? Your condition will be monitored carefully while you are receiving this medicine. If being treated for Dupuytren's contracture, return to your healthcare provider the day after your hand is injected. In the meantime, do not flex or extend the fingers of your hand that was injected. Do not touch your finger that was injected, and elevate your hand until bedtime. Do not perform activity with the injected hand until you are told that it is OK. Follow any instructions about wearing a splint or performing finger exercises. Also, call your healthcare provider if you get increasing redness or swelling in the hand, if you have numbness or tingling in the treated finger, or if you have trouble bending the finger after the swelling goes down. If being treated for Peyronie's disease, you will need to return to your healthcare provider for a manual procedure that will stretch and help straighten your penis. Also, your healthcare provider will show you how to gently stretch your penis at home. Do not resume sexual activity until you are told that it is okay. Follow instructions on when to return for follow-up visits. Immediately call your doctor if you have trouble stretching or straightening your penis, or if you have pain or other concerns. Immediately call your healthcare provider if you get a fever or chills. What side effects may I notice from receiving this medicine? Side effects that you should report to your doctor or health   care professional as soon as possible:  allergic reactions like skin rash, itching or hives, swelling of the face, lips, or tongue  chest pain or palpitations  infection (fever, chills, increasing redness or swelling at treated site)  numbness or tingling in a treated  hand  popping sound or sensation in an erect penis  sudden back pain or trouble walking  sudden loss of the ability to maintain an erection  trouble breathing  trouble urinating or blood in the urine  unusual pain, swelling, or bruising of the penis Side effects that usually do not require medical attention (report to your doctor or health care professional if they continue or are bothersome):  bleeding or bruising at site where injected  irritation at site where injected  mild pain or swelling at site where injected This list may not describe all possible side effects. Call your doctor for medical advice about side effects. You may report side effects to FDA at 1-800-FDA-1088. Where should I keep my medicine? This drug is given in a hospital or clinic and will not be stored at home. NOTE: This sheet is a summary. It may not cover all possible information. If you have questions about this medicine, talk to your doctor, pharmacist, or health care provider.  2021 Elsevier/Gold Standard (2020-02-12 17:03:04)  

## 2020-12-26 NOTE — Progress Notes (Signed)
12/26/2020 2:52 PM   Lewanda Rife 06/04/1966 [id number]  Referring provider: No referring provider defined for this encounter.  Peyronies disease  HPI: Jeffery Cabrera is a 55yo here for followup for peyronies disease. He notes no change in curvature over the past 6 months. No pain with erections. He gets a semifirm erection past the curvature. No other complaints today   PMH: Past Medical History:  Diagnosis Date  . Pneumonia     Surgical History: No past surgical history on file.  Home Medications:  Allergies as of 12/26/2020   No Known Allergies     Medication List       Accurate as of December 26, 2020  2:52 PM. If you have any questions, ask your nurse or doctor.        albuterol 108 (90 Base) MCG/ACT inhaler Commonly known as: VENTOLIN HFA Inhale 2 puffs into the lungs every 6 (six) hours as needed for wheezing or shortness of breath.   Breo Ellipta 100-25 MCG/INH Aepb Generic drug: fluticasone furoate-vilanterol Inhale 1 puff into the lungs daily.   pentoxifylline 400 MG CR tablet Commonly known as: TRENTAL Take 1 tablet (400 mg total) by mouth in the morning and at bedtime.       Allergies: No Known Allergies  Family History: No family history on file.  Social History:  reports that he has never smoked. He has never used smokeless tobacco. He reports that he does not drink alcohol and does not use drugs.  ROS: All other review of systems were reviewed and are negative except what is noted above in HPI  Physical Exam: BP (!) 159/97   Pulse 97   Temp 98.5 F (36.9 C)   Ht 6\' 2"  (1.88 m)   Wt 240 lb (108.9 kg)   BMI 30.81 kg/m   Constitutional:  Alert and oriented, No acute distress. HEENT: [place] AT, moist mucus membranes.  Trachea midline, no masses. Cardiovascular: No clubbing, cyanosis, or edema. Respiratory: Normal respiratory effort, no increased work of breathing. GI: Abdomen is soft, nontender, nondistended, no abdominal masses GU:  No CVA tenderness.  Lymph: No cervical or inguinal lymphadenopathy. Skin: No rashes, bruises or suspicious lesions. Neurologic: Grossly intact, no focal deficits, moving all 4 extremities. Psychiatric: Normal mood and affect.  Laboratory Data: Lab Results  Component Value Date   WBC 9.6 03/31/2020   HGB 14.2 03/31/2020   HCT 43.5 03/31/2020   MCV 105.3 (H) 03/31/2020   PLT 268 03/31/2020    Lab Results  Component Value Date   CREATININE 1.27 (H) 03/31/2020    No results found for: PSA  No results found for: TESTOSTERONE  No results found for: HGBA1C  Urinalysis No results found for: COLORURINE, APPEARANCEUR, LABSPEC, PHURINE, GLUCOSEU, HGBUR, BILIRUBINUR, KETONESUR, PROTEINUR, UROBILINOGEN, NITRITE, LEUKOCYTESUR  No results found for: LABMICR, WBCUA, RBCUA, LABEPIT, MUCUS, BACTERIA  Pertinent Imaging:  No results found for this or any previous visit.  No results found for this or any previous visit.  No results found for this or any previous visit.  No results found for this or any previous visit.  No results found for this or any previous visit.  No results found for this or any previous visit.  No results found for this or any previous visit.  No results found for this or any previous visit.   Assessment & Plan:    1. Peyronie disease We discussed the management of peyronies disease including medical therapy, penile plication, verapamil therapy and  xiaflex therapy. After discussed the options the patient elects for xiaflex therapy. - Urinalysis, Routine w reflex microscopic   No follow-ups on file.  Wilkie Aye, MD  [facility] [place]

## 2021-07-19 IMAGING — DX DG CHEST 1V PORT
1 series · 1 of 1 positions shown · non-contrast
Comparison: 03/18/2020

CLINICAL DATA: Shortness of breath for 1 month

EXAM:
PORTABLE CHEST 1 VIEW

[chest ap grid]
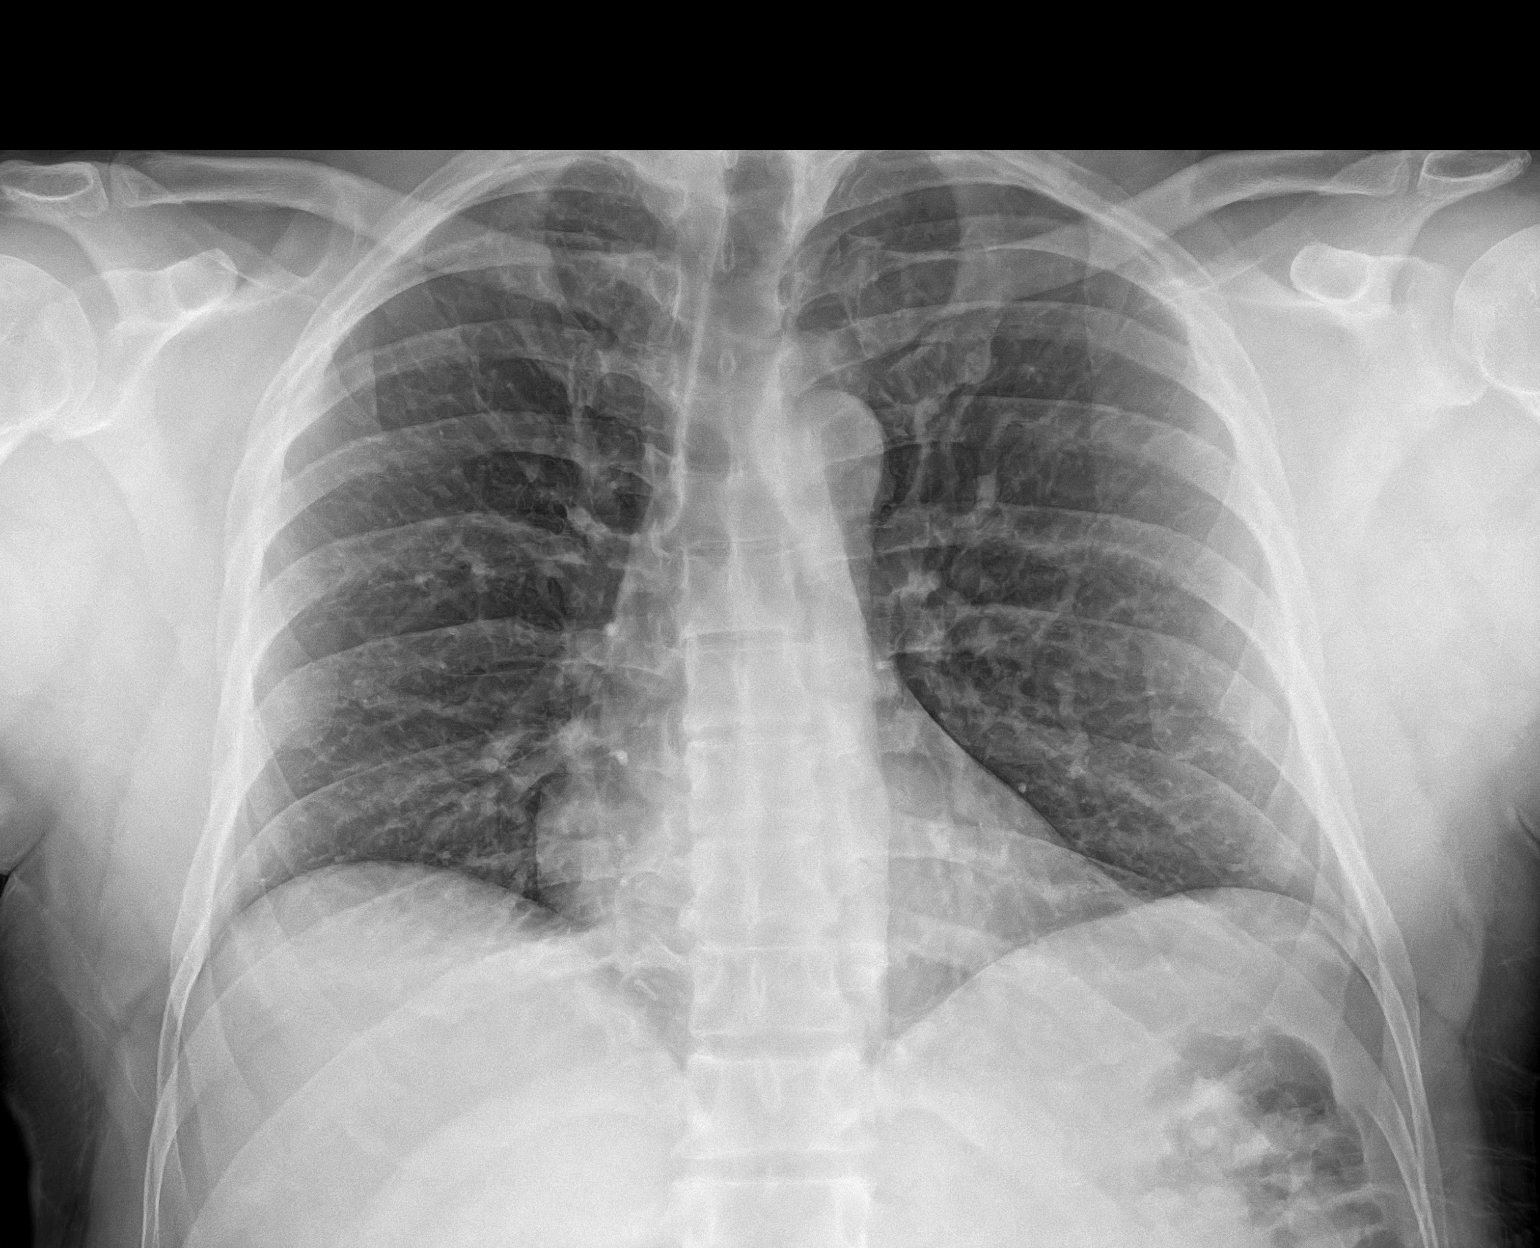

[1 of 1 positions shown; findings below may reference images not displayed]

FINDINGS: Cardiac shadow is within normal limits. The lungs are well aerated
bilaterally. No focal infiltrate or sizable effusion is seen. No
bony abnormality is noted.
IMPRESSION: No active disease.

## 2021-10-11 ENCOUNTER — Other Ambulatory Visit: Payer: Self-pay

## 2021-10-11 ENCOUNTER — Emergency Department (HOSPITAL_COMMUNITY)
Admission: EM | Admit: 2021-10-11 | Discharge: 2021-10-11 | Disposition: A | Discharge status: Home / Self Care | Payer: 59 | Attending: Emergency Medicine | Admitting: Emergency Medicine

## 2021-10-11 ENCOUNTER — Encounter (HOSPITAL_COMMUNITY): Payer: Self-pay | Admitting: *Deleted

## 2021-10-11 DIAGNOSIS — R000 Tachycardia, unspecified: Secondary | ICD-10-CM | POA: Insufficient documentation

## 2021-10-11 DIAGNOSIS — R059 Cough, unspecified: Secondary | ICD-10-CM | POA: Diagnosis present

## 2021-10-11 DIAGNOSIS — Z79899 Other long term (current) drug therapy: Secondary | ICD-10-CM | POA: Diagnosis not present

## 2021-10-11 DIAGNOSIS — J101 Influenza due to other identified influenza virus with other respiratory manifestations: Secondary | ICD-10-CM | POA: Insufficient documentation

## 2021-10-11 DIAGNOSIS — J45909 Unspecified asthma, uncomplicated: Secondary | ICD-10-CM | POA: Insufficient documentation

## 2021-10-11 DIAGNOSIS — Z20822 Contact with and (suspected) exposure to covid-19: Secondary | ICD-10-CM | POA: Insufficient documentation

## 2021-10-11 LAB — RESP PANEL BY RT-PCR (FLU A&B, COVID) ARPGX2
Influenza A by PCR: POSITIVE — AB
Influenza B by PCR: NEGATIVE
SARS Coronavirus 2 by RT PCR: NEGATIVE

## 2021-10-11 MED ORDER — OSELTAMIVIR PHOSPHATE 75 MG PO CAPS
75.0000 mg | ORAL_CAPSULE | Freq: Two times a day (BID) | ORAL | 0 refills | Status: DC
Start: 2021-10-11 — End: 2022-09-24

## 2021-10-11 MED ORDER — ACETAMINOPHEN 325 MG PO TABS
650.0000 mg | ORAL_TABLET | Freq: Once | ORAL | Status: AC
  Administered 2021-10-11: 20:00:00 650 mg via ORAL
  Filled 2021-10-11: qty 2

## 2021-10-11 NOTE — ED Provider Notes (Signed)
[facility] EMERGENCY DEPARTMENT Provider Note   CSN: [id number] Arrival date & time: 10/11/21  1938     History Chief Complaint  Patient presents with   Sore Throat    Jeffery Cabrera is a 55 y.o. male.   Sore Throat Pertinent negatives include no chest pain and no abdominal pain. Patient with history of reactive airway disease.  Presents with shortness of breath coughing with fevers.  Has had since yesterday.  Also sore throat.  Body aches.  No real production with the cough.  Found to have a fever upon arrival.  Symptoms began yesterday.  No known sick contacts.     Past Medical History:  Diagnosis Date   Pneumonia     Patient Active Problem List   Diagnosis Date Noted   Reactive airway disease with wheezing 05/30/2020    History reviewed. No pertinent surgical history.     History reviewed. No pertinent family history.  Social History   Tobacco Use   Smoking status: Never   Smokeless tobacco: Never  Vaping Use   Vaping Use: Never used  Substance Use Topics   Alcohol use: No   Drug use: No    Home Medications Prior to Admission medications   Medication Sig Start Date End Date Taking? Authorizing Provider  oseltamivir (TAMIFLU) 75 MG capsule Take 1 capsule (75 mg total) by mouth every 12 (twelve) hours. 10/11/21  Yes Benjiman Core, MD  albuterol (VENTOLIN HFA) 108 (90 Base) MCG/ACT inhaler Inhale 2 puffs into the lungs every 6 (six) hours as needed for wheezing or shortness of breath. 05/30/20   Oretha Milch, MD  fluticasone furoate-vilanterol (BREO ELLIPTA) 100-25 MCG/INH AEPB Inhale 1 puff into the lungs daily. 05/30/20   Oretha Milch, MD  pentoxifylline (TRENTAL) 400 MG CR tablet Take 1 tablet (400 mg total) by mouth in the morning and at bedtime. 08/08/20   McKenzie, Mardene Celeste, MD    Allergies    Patient has no known allergies.  Review of Systems   Review of Systems  Constitutional:  Positive for appetite change, chills and fever.   HENT:  Positive for congestion and sore throat. Negative for trouble swallowing.   Respiratory:  Positive for cough.   Cardiovascular:  Negative for chest pain.  Gastrointestinal:  Negative for abdominal pain.  Genitourinary:  Negative for frequency.  Musculoskeletal:  Positive for myalgias.  Skin:  Negative for rash.  Neurological:  Negative for tremors.  Psychiatric/Behavioral:  Negative for confusion.    Physical Exam Updated Vital Signs BP (!) 167/96 (BP Location: Right Arm)    Pulse (!) 137    Temp (!) 102.3 F (39.1 C) (Oral)    Resp 20    Ht 6\' 1"  (1.854 m)    Wt 111.1 kg    SpO2 95%    BMI 32.32 kg/m   Physical Exam Vitals and nursing note reviewed.  HENT:     Head: Normocephalic.     Nose: Congestion present.     Mouth/Throat:     Pharynx: Posterior oropharyngeal erythema present. No oropharyngeal exudate.     Tonsils: No tonsillar abscesses.  Eyes:     Conjunctiva/sclera: Conjunctivae normal.  Cardiovascular:     Rate and Rhythm: Regular rhythm. Tachycardia present.  Pulmonary:     Breath sounds: No wheezing or rhonchi.  Abdominal:     Tenderness: There is no abdominal tenderness.  Skin:    General: Skin is warm.     Capillary Refill: Capillary refill  takes less than 2 seconds.  Neurological:     Mental Status: He is alert and oriented to person, place, and time.    ED Results / Procedures / Treatments   Labs (all labs ordered are listed, but only abnormal results are displayed) Labs Reviewed  RESP PANEL BY RT-PCR (FLU A&B, COVID) ARPGX2 - Abnormal; Notable for the following components:      Result Value   Influenza A by PCR POSITIVE (*)    All other components within normal limits    EKG None  Radiology No results found.  Procedures Procedures   Medications Ordered in ED Medications  acetaminophen (TYLENOL) tablet 650 mg (650 mg Oral Given 10/11/21 1949)    ED Course  I have reviewed the triage vital signs and the nursing  notes.  Pertinent labs & imaging results that were available during my care of the patient were reviewed by me and considered in my medical decision making (see chart for details).    MDM Rules/Calculators/A&P                           Patient's URI symptoms.  Lungs clear.  Did have a tachycardia but improved with time and some defervesced since.  Not hypoxic.  Is high risk with his bronchitis/asthma history.  Not currently wheezing.  However COVID positive.  Do not feels we need x-ray no focal findings and doubt pneumonia.  Does qualify for Tamiflu.  Will discharge home Final Clinical Impression(s) / ED Diagnoses Final diagnoses:  Influenza A    Rx / DC Orders ED Discharge Orders          Ordered    oseltamivir (TAMIFLU) 75 MG capsule  Every 12 hours        10/11/21 2100             Benjiman Core, MD 10/11/21 2116

## 2021-10-11 NOTE — ED Triage Notes (Signed)
Cough, sore throat, body aches the other day. Denies any fever or N/v/D

## 2021-10-30 ENCOUNTER — Emergency Department (HOSPITAL_COMMUNITY)
Admission: EM | Admit: 2021-10-30 | Discharge: 2021-10-31 | Disposition: A | Discharge status: Home / Self Care | Payer: 59 | Attending: Emergency Medicine | Admitting: Emergency Medicine

## 2021-10-30 ENCOUNTER — Other Ambulatory Visit: Payer: Self-pay

## 2021-10-30 DIAGNOSIS — J40 Bronchitis, not specified as acute or chronic: Secondary | ICD-10-CM | POA: Diagnosis not present

## 2021-10-30 DIAGNOSIS — R0602 Shortness of breath: Secondary | ICD-10-CM | POA: Diagnosis present

## 2021-10-30 NOTE — ED Triage Notes (Signed)
Cough and shortness of breath. Pt seen for same 1 week ago, diagnosed with flu. Pt took prescribed medications for flu.

## 2021-10-30 NOTE — ED Triage Notes (Signed)
Pt reports that he is coughing frequently.

## 2021-10-31 MED ORDER — DOXYCYCLINE HYCLATE 100 MG PO TABS
100.0000 mg | ORAL_TABLET | Freq: Once | ORAL | Status: AC
  Administered 2021-10-31: 100 mg via ORAL
  Filled 2021-10-31: qty 1

## 2021-10-31 MED ORDER — IPRATROPIUM-ALBUTEROL 0.5-2.5 (3) MG/3ML IN SOLN
3.0000 mL | Freq: Once | RESPIRATORY_TRACT | Status: AC
  Administered 2021-10-31: 3 mL via RESPIRATORY_TRACT
  Filled 2021-10-31: qty 3

## 2021-10-31 MED ORDER — PREDNISONE 20 MG PO TABS: 40.0000 mg | ORAL_TABLET | Freq: Every day | ORAL | 0 refills | Status: AC

## 2021-10-31 MED ORDER — ALBUTEROL SULFATE HFA 108 (90 BASE) MCG/ACT IN AERS
2.0000 | INHALATION_SPRAY | Freq: Once | RESPIRATORY_TRACT | Status: AC
  Administered 2021-10-31: 2 via RESPIRATORY_TRACT
  Filled 2021-10-31: qty 6.7

## 2021-10-31 MED ORDER — DOXYCYCLINE HYCLATE 100 MG PO CAPS: 100.0000 mg | ORAL_CAPSULE | Freq: Two times a day (BID) | ORAL | 0 refills | Status: AC

## 2021-10-31 MED ORDER — PREDNISONE 50 MG PO TABS
60.0000 mg | ORAL_TABLET | Freq: Once | ORAL | Status: AC
  Administered 2021-10-31: 60 mg via ORAL
  Filled 2021-10-31: qty 1

## 2021-10-31 NOTE — ED Provider Notes (Signed)
[facility] Emergency Department Provider Note MRN:  [id number]  Arrival date & time: 10/31/21     Chief Complaint   Cough and Shortness of Breath   History of Present Illness   Jeffery Cabrera is a 56 y.o. year-old male with no pertinent past medical history presenting to the ED with chief complaint of cough and shortness of breath.  Had the flu 2 weeks ago, was feeling better, now with worsening cough, chest congestion, dyspnea.  Some soreness in the chest when he coughs.  Denies any abdominal pain, no nausea vomiting, no diarrhea.  Review of Systems  A thorough review of systems was obtained and all systems are negative except as noted in the HPI and PMH.   Patient's Health History    Past Medical History:  Diagnosis Date   Pneumonia     No past surgical history on file.  No family history on file.  Social History   Socioeconomic History   Marital status: Legally Separated    Spouse name: Not on file   Number of children: 2   Years of education: Not on file   Highest education level: Not on file  Occupational History   Occupation: customer service mgr.  Tobacco Use   Smoking status: Never   Smokeless tobacco: Never  Vaping Use   Vaping Use: Never used  Substance and Sexual Activity   Alcohol use: No   Drug use: No   Sexual activity: Not on file  Other Topics Concern   Not on file  Social History Narrative   Not on file   Social Determinants of Health   Financial Resource Strain: Not on file  Food Insecurity: Not on file  Transportation Needs: Not on file  Physical Activity: Not on file  Stress: Not on file  Social Connections: Not on file  Intimate Partner Violence: Not on file     Physical Exam   Vitals:   10/31/21 0141 10/31/21 0200  BP: 135/87 138/90  Pulse: 90 80  Resp: (!) 22 19  Temp:    SpO2: 94% 94%    CONSTITUTIONAL: Well-appearing, NAD NEURO:  Alert and oriented x 3, no focal deficits EYES:  eyes equal  and reactive ENT/NECK:  no LAD, no JVD CARDIO: Regular rate, well-perfused, normal S1 and S2 PULM: Scattered rhonchi, no increased work of breathing GI/GU:  non-distended, non-tender MSK/SPINE:  No gross deformities, no edema SKIN:  no rash, atraumatic   *Additional and/or pertinent findings included in MDM below  Diagnostic and Interventional Summary    EKG Interpretation  Date/Time:    Ventricular Rate:    PR Interval:    QRS Duration:   QT Interval:    QTC Calculation:   R Axis:     Text Interpretation:         Labs Reviewed - No data to display  No orders to display    Medications  ipratropium-albuterol (DUONEB) 0.5-2.5 (3) MG/3ML nebulizer solution 3 mL (has no administration in time range)  albuterol (VENTOLIN HFA) 108 (90 Base) MCG/ACT inhaler 2 puff (has no administration in time range)  predniSONE (DELTASONE) tablet 60 mg (60 mg Oral Given 10/31/21 0301)  doxycycline (VIBRA-TABS) tablet 100 mg (100 mg Oral Given 10/31/21 0300)     Procedures  /  Critical Care Procedures  ED Course and Medical Decision Making  Initial Impression and Ddx Suspect either bronchitis or secondary bacterial pneumonia.  Vital signs normal, overall well-appearing.  Had considered chest x-ray however  given the high clinical suspicion for bacterial pneumonia and patient's history of bacterial pneumonia, will treat empirically with doxycycline.  Patient Reassessment and Ultimate Disposition/Management Discharge home with meds listed below.   Complexity of Problems Addressed Acute uncomplicated illness or injury with no diagnostics  Additional Data Reviewed and Analyzed Further history obtained from: Past medical history and medications listed in the EMR  Patient Encounter Risk Assessment Moderate:  Prescriptions  Elmer Sow. Pilar Plate, MD [facility] Emergency [facility] [email or web address]  Final Clinical Impressions(s) / ED Diagnoses     ICD-10-CM   1.  Bronchitis  J40       ED Discharge Orders          Ordered    doxycycline (VIBRAMYCIN) 100 MG capsule  2 times daily        10/31/21 0316    predniSONE (DELTASONE) 20 MG tablet  Daily        10/31/21 0316             Discharge Instructions Discussed with and Provided to Patient:    Discharge Instructions      You were evaluated in the Emergency Department and after careful evaluation, we did not find any emergent condition requiring admission or further testing in the hospital.  Your exam/testing today is overall reassuring.  Symptoms may be due to a bacterial pneumonia or a bronchitis.  Take the steroids and doxycycline antibiotic as directed.  Use the inhaler as needed for coughing fits.  Please return to the Emergency Department if you experience any worsening of your condition.   Thank you for allowing [place] to be a part of your care.       Sabas Sous, MD 10/31/21 [phone]

## 2021-10-31 NOTE — Discharge Instructions (Signed)
You were evaluated in the Emergency Department and after careful evaluation, we did not find any emergent condition requiring admission or further testing in the hospital.  Your exam/testing today is overall reassuring.  Symptoms may be due to a bacterial pneumonia or a bronchitis.  Take the steroids and doxycycline antibiotic as directed.  Use the inhaler as needed for coughing fits.  Please return to the Emergency Department if you experience any worsening of your condition.   Thank you for allowing [place] to be a part of your care.

## 2022-02-22 ENCOUNTER — Ambulatory Visit
Admission: EM | Admit: 2022-02-22 | Discharge: 2022-02-22 | Disposition: A | Discharge status: Home / Self Care | Payer: 59 | Attending: Family Medicine | Admitting: Family Medicine

## 2022-02-22 DIAGNOSIS — K429 Umbilical hernia without obstruction or gangrene: Secondary | ICD-10-CM

## 2022-02-22 NOTE — ED Provider Notes (Signed)
?  [facility] ? ? ?[id number] ?02/22/22 Arrival Time: 0921 ? ?ASSESSMENT & PLAN: ? ?1. Umbilical hernia without obstruction and without gangrene   ? ?Possibly with abd wall muscle strain. No signs of strangulation. ?Work note provided. ?Benign abdominal exam. No indications for urgent abdominal/pelvic imaging at this time. Discussed. ? ?Recommend: ? Follow-up Information   ? ? Schedule an appointment as soon as possible for a visit  with Franky Macho, MD.   ?Specialty: General Surgery ?Contact information: ?[address] ?[place] [address] ?[phone] ? ? ?  ?  ? ?  ?  ? ?  ? ? ?Reviewed expectations re: course of current medical issues. Questions answered. ?Outlined signs and symptoms indicating need for more acute intervention. ?Patient verbalized understanding. ?After Visit Summary given. ? ? ?SUBJECTIVE: ?History from: patient. ?Jeffery Cabrera is a 56 y.o. male who presents with complaint of pain around umbilicus; umbilical bulging has been present since childhood. 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 No bruising or color changes of skin. Pain worse with certain movements. Normal bowel/bladder habits. ?No h/o abd surgery. ? ?History reviewed. No pertinent surgical history. ? ? ?OBJECTIVE: ? ?Vitals:  ? 02/22/22 1056  ?BP: 137/71  ?Pulse: 78  ?Resp: 18  ?Temp: 98.1 ?F (36.7 ?C)  ?TempSrc: Oral  ?SpO2: 97%  ?  ?General appearance: alert, oriented, no acute distress ?HEENT: [place]; AT; oropharynx moist ?Lungs: unlabored respirations ?Abdomen: soft; without distention; reducible umbilical hernia present; no skin discoloration or bruising ?Neurologic: normal gait ?Psychological: alert and cooperative; normal mood and affect ? ? ?No Known Allergies ?                                            ?Past Medical History:  ?Diagnosis Date  ? Pneumonia   ? ? ?Social History  ? ?Socioeconomic History  ? Marital status: Legally Separated  ?  Spouse name: Not  on file  ? Number of children: 2  ? Years of education: Not on file  ? Highest education level: Not on file  ?Occupational History  ? Occupation: Public house manager.  ?Tobacco Use  ? Smoking status: Never  ? Smokeless tobacco: Never  ?Vaping Use  ? Vaping Use: Never used  ?Substance and Sexual Activity  ? Alcohol use: Not Currently  ? Drug use: No  ? Sexual activity: Yes  ?Other Topics Concern  ? Not on file  ?Social History Narrative  ? Not on file  ? ?Social Determinants of Health  ? ?Financial Resource Strain: Not on file  ?Food Insecurity: Not on file  ?Transportation Needs: Not on file  ?Physical Activity: Not on file  ?Stress: Not on file  ?Social Connections: Not on file  ?Intimate Partner Violence: Not on file  ? ? ?History reviewed. No pertinent family history. ?  Mardella Layman, MD ?02/22/22 1128 ? ?

## 2022-02-22 NOTE — ED Triage Notes (Signed)
Pt reports pain, swelling around the navel x 1 week. Pt reports he lift weight and think he has an umbilical hernia. Pain is worse when twisting or turning in the bed. Ibuprofen gives relief.  ?

## 2022-03-22 ENCOUNTER — Encounter: Payer: Self-pay | Admitting: Family Medicine

## 2022-03-22 ENCOUNTER — Ambulatory Visit (INDEPENDENT_AMBULATORY_CARE_PROVIDER_SITE_OTHER): Payer: 59 | Admitting: Family Medicine

## 2022-03-22 VITALS — BP 142/82 | HR 103 | Ht 74.0 in | Wt 249.6 lb

## 2022-03-22 DIAGNOSIS — N529 Male erectile dysfunction, unspecified: Secondary | ICD-10-CM

## 2022-03-22 DIAGNOSIS — R7301 Impaired fasting glucose: Secondary | ICD-10-CM

## 2022-03-22 DIAGNOSIS — E559 Vitamin D deficiency, unspecified: Secondary | ICD-10-CM

## 2022-03-22 DIAGNOSIS — G4730 Sleep apnea, unspecified: Secondary | ICD-10-CM

## 2022-03-22 DIAGNOSIS — Z23 Encounter for immunization: Secondary | ICD-10-CM | POA: Diagnosis not present

## 2022-03-22 DIAGNOSIS — Z114 Encounter for screening for human immunodeficiency virus [HIV]: Secondary | ICD-10-CM | POA: Diagnosis not present

## 2022-03-22 DIAGNOSIS — Z1211 Encounter for screening for malignant neoplasm of colon: Secondary | ICD-10-CM | POA: Diagnosis not present

## 2022-03-22 DIAGNOSIS — N528 Other male erectile dysfunction: Secondary | ICD-10-CM

## 2022-03-22 DIAGNOSIS — R202 Paresthesia of skin: Secondary | ICD-10-CM

## 2022-03-22 DIAGNOSIS — R200 Anesthesia of skin: Secondary | ICD-10-CM

## 2022-03-22 DIAGNOSIS — M359 Systemic involvement of connective tissue, unspecified: Secondary | ICD-10-CM

## 2022-03-22 MED ORDER — SILDENAFIL CITRATE 25 MG PO TABS: 25.0000 mg | ORAL_TABLET | Freq: Every day | ORAL | 0 refills | Status: DC | PRN

## 2022-03-22 NOTE — Progress Notes (Addendum)
New Patient Office Visit  Subjective:  Patient ID: Jeffery Cabrera, male    DOB: 1966-03-26  Age: 56 y.o. MRN: [id number]  CC:  Chief Complaint  Patient presents with   New Patient (Initial Visit)    Establishing care, pt complains of umbilical hernia onset 15/17/6160 , pt complains of sx of Multiple Sclerosis would like to be testing for this, states he has been sleeping more than usual thinks it could be sleep apnea.     HPI  Jeffery Cabrera is a 56 y.o. Male with past medical history of reactive airway disease presents for establishing care.  He reports generalized itching, needle pricking, and tingling sensation with numbness. Symptoms onset was ten years ago. He mentions feeling like his legs are vibrating with increased fatigued. He reports increased thirst and hunger with occasional confusion. He attributes his symptoms to possibly MS. Sleep apnea: he reports snoring loudly at bedtime with apneic episodes. He shared feeling tired after a full night's sleep. He mentions having symptoms for a few years. Erectile dysfunction: He reports an inability to sustain and maintain an erection. Symptom onset was 3 years ago, and he will like treatment.  Past Medical History:  Diagnosis Date   Pneumonia     History reviewed. No pertinent surgical history.  History reviewed. No pertinent family history.  Social History   Socioeconomic History   Marital status: Legally Separated    Spouse name: Not on file   Number of children: 2   Years of education: Not on file   Highest education level: Not on file  Occupational History   Occupation: customer service mgr.  Tobacco Use   Smoking status: Never   Smokeless tobacco: Never  Vaping Use   Vaping Use: Never used  Substance and Sexual Activity   Alcohol use: Not Currently   Drug use: No   Sexual activity: Yes  Other Topics Concern   Not on file  Social History Narrative   Not on file   Social Determinants of Health    Financial Resource Strain: Not on file  Food Insecurity: Not on file  Transportation Needs: Not on file  Physical Activity: Not on file  Stress: Not on file  Social Connections: Not on file  Intimate Partner Violence: Not on file    ROS Review of Systems  Constitutional:  Positive for fatigue. Negative for chills and fever.  HENT:  Negative for sinus pressure, sinus pain, sneezing and sore throat.   Eyes:  Negative for discharge, itching and visual disturbance.  Respiratory:  Negative for chest tightness and shortness of breath.   Cardiovascular:  Negative for chest pain and palpitations.  Gastrointestinal:  Negative for constipation, diarrhea, nausea and vomiting.  Endocrine: Positive for polydipsia and polyuria. Negative for polyphagia.  Genitourinary:  Negative for frequency and urgency.  Musculoskeletal:  Negative for back pain, neck pain and neck stiffness.  Neurological:  Positive for numbness. Negative for dizziness and headaches.  Psychiatric/Behavioral:  Positive for confusion (sometimes) and sleep disturbance. Negative for self-injury and suicidal ideas.    Objective:   Today's Vitals: BP (!) 142/82   Pulse (!) 103   Ht 6' 2"  (1.88 m)   Wt 249 lb 9.6 oz (113.2 kg)   SpO2 94%   BMI 32.05 kg/m   Physical Exam HENT:     Head: Normocephalic.     Right Ear: External ear normal.     Left Ear: External ear normal.     Nose: No  congestion or rhinorrhea.     Mouth/Throat:     Mouth: Mucous membranes are moist.  Eyes:     Extraocular Movements: Extraocular movements intact.     Pupils: Pupils are equal, round, and reactive to light.  Cardiovascular:     Rate and Rhythm: Normal rate and regular rhythm.     Pulses: Normal pulses.     Heart sounds: Normal heart sounds.  Pulmonary:     Effort: Pulmonary effort is normal.     Breath sounds: Normal breath sounds.  Abdominal:     General: There is no distension.     Palpations: Abdomen is soft. There is no mass.   Musculoskeletal:     Cervical back: No rigidity or tenderness.     Right lower leg: No edema.     Left lower leg: No edema.  Skin:    General: Skin is warm.  Neurological:     General: No focal deficit present.     Mental Status: He is alert.  Psychiatric:     Comments: Normal affect    Assessment & Plan:   Problem List Items Addressed This Visit       Respiratory   Sleep apnea    -symptoms reported consistent with sleep apnea -will order a home sleep study and evaluate and treat properly          Other   Erectile dysfunction - Primary    -inability to maintain erection -will start the patient on Viagra and increase the dose as needed -recommended to take 1 hour prior to sexual activity; taking from 0.5 to 4 hours prior also acceptable  -do not take >1 dose per day -May take with or without food; avoid taking with a high-fat meal -May cause headache, dizziness (especially upon standing), nose bleed, upset stomach, flushing, or nasal congestion       Relevant Medications   sildenafil (VIAGRA) 25 MG tablet   Numbness and tingling sensation of skin    -symptoms unlikely of MS, but will assess for autoimmune disease  -pending ANA and CRP -pending hgA1c        Other Visit Diagnoses     Colon cancer screening       Relevant Orders   Ambulatory referral to Gastroenterology   Sleep apnea in adult       Relevant Orders   CBC with Differential/Platelet   CMP14+EGFR   TSH + free T4   Lipid panel   Home sleep test   Encounter for screening for HIV       Relevant Orders   HIV antibody (with reflex)   Vitamin D deficiency       Relevant Orders   Vitamin D (25 hydroxy)   IFG (impaired fasting glucose)       Relevant Orders   Hemoglobin A1c   Need for Tdap vaccination       Relevant Orders   Tdap vaccine greater than or equal to 7yo IM (Completed)   Need for shingles vaccine       Relevant Orders   Varicella-zoster vaccine IM (Shingrix) (Completed)    Autoimmune disease ([place])       Relevant Orders   Antinuclear Antib (ANA)   C-reactive protein       Outpatient Encounter Medications as of 03/22/2022  Medication Sig   ibuprofen (ADVIL) 200 MG tablet Take 200 mg by mouth every 6 (six) hours as needed.   sildenafil (VIAGRA) 25 MG tablet Take 1 tablet (25 mg  total) by mouth daily as needed for erectile dysfunction.   albuterol (VENTOLIN HFA) 108 (90 Base) MCG/ACT inhaler Inhale 2 puffs into the lungs every 6 (six) hours as needed for wheezing or shortness of breath. (Patient not taking: Reported on 03/22/2022)   fluticasone furoate-vilanterol (BREO ELLIPTA) 100-25 MCG/INH AEPB Inhale 1 puff into the lungs daily. (Patient not taking: Reported on 03/22/2022)   oseltamivir (TAMIFLU) 75 MG capsule Take 1 capsule (75 mg total) by mouth every 12 (twelve) hours. (Patient not taking: Reported on 03/22/2022)   pentoxifylline (TRENTAL) 400 MG CR tablet Take 1 tablet (400 mg total) by mouth in the morning and at bedtime. (Patient not taking: Reported on 03/22/2022)   No facility-administered encounter medications on file as of 03/22/2022.    Follow-up: Return in about 4 months (around 07/23/2022).   Alvira Monday, FNP

## 2022-03-22 NOTE — Patient Instructions (Addendum)
I appreciate the opportunity to provide care to you today!    Follow up:  4 months  Labs: please stop by the lab anytime during the week to get your blood drawn (CBC, CMP, TSH, Lipid profile, HgA1c, Vit D)  Screening: HIV          Viagra -recommended to take 1 hour prior to sexual activity; taking from 0.5 to 4 hours prior also acceptable  -do not take >1 dose per day -May take with or without food; avoid taking with a high-fat meal -May cause headache, dizziness (especially upon standing), nose bleed, upset stomach, flushing, or nasal congestion  -Home sleep Test for Sleep Apnea  Thank you for getting your Tdap and Shingles Vaccine today   Please continue to a heart-healthy diet and increase your physical activities. Try to exercise for at least three times a week.      It was a pleasure to see you and I look forward to continuing to work together on your health and well-being. Please do not hesitate to call the office if you need care or have questions about your care.   Have a wonderful day and week. With Gratitude, Gilmore Laroche MSN, FNP-BC

## 2022-03-22 NOTE — Assessment & Plan Note (Addendum)
-  symptoms reported consistent with sleep apnea -will order a home sleep study and evaluate and treat properly

## 2022-03-22 NOTE — Assessment & Plan Note (Addendum)
-  inability to maintain erection -will start the patient on Viagra and increase the dose as needed -recommended to take 1 hour prior to sexual activity; taking from 0.5 to 4 hours prior also acceptable  -do not take >1 dose per day -May take with or without food; avoid taking with a high-fat meal -May cause headache, dizziness (especially upon standing), nose bleed, upset stomach, flushing, or nasal congestion

## 2022-03-23 DIAGNOSIS — R202 Paresthesia of skin: Secondary | ICD-10-CM | POA: Insufficient documentation

## 2022-03-23 NOTE — Assessment & Plan Note (Signed)
-  symptoms unlikely of MS, but will assess for autoimmune disease  -pending ANA and CRP -pending hgA1c

## 2022-03-28 ENCOUNTER — Encounter: Payer: Self-pay | Admitting: *Deleted

## 2022-04-03 LAB — CMP14+EGFR
ALT: 23 IU/L (ref 0–44)
AST: 22 IU/L (ref 0–40)
Albumin/Globulin Ratio: 1.5 (ref 1.2–2.2)
Albumin: 4.3 g/dL (ref 3.8–4.9)
Alkaline Phosphatase: 66 IU/L (ref 44–121)
BUN/Creatinine Ratio: 10 (ref 9–20)
BUN: 13 mg/dL (ref 6–24)
Bilirubin Total: 0.5 mg/dL (ref 0.0–1.2)
CO2: 22 mmol/L (ref 20–29)
Calcium: 9.3 mg/dL (ref 8.7–10.2)
Chloride: 105 mmol/L (ref 96–106)
Creatinine, Ser: 1.24 mg/dL (ref 0.76–1.27)
Globulin, Total: 2.9 g/dL (ref 1.5–4.5)
Glucose: 121 mg/dL — ABNORMAL HIGH (ref 70–99)
Potassium: 4.5 mmol/L (ref 3.5–5.2)
Sodium: 144 mmol/L (ref 134–144)
Total Protein: 7.2 g/dL (ref 6.0–8.5)
eGFR: 70 mL/min/{1.73_m2} (ref >59)

## 2022-04-03 LAB — LIPID PANEL
Chol/HDL Ratio: 5.6 ratio — ABNORMAL HIGH (ref 0.0–5.0)
Cholesterol, Total: 195 mg/dL (ref 100–199)
HDL: 35 mg/dL — ABNORMAL LOW (ref >39)
LDL Chol Calc (NIH): 146 mg/dL — ABNORMAL HIGH (ref 0–99)
Triglycerides: 74 mg/dL (ref 0–149)
VLDL Cholesterol Cal: 14 mg/dL (ref 5–40)

## 2022-04-03 LAB — HIV ANTIBODY (ROUTINE TESTING W REFLEX): HIV Screen 4th Generation wRfx: NONREACTIVE

## 2022-04-03 LAB — VITAMIN D 25 HYDROXY (VIT D DEFICIENCY, FRACTURES): Vit D, 25-Hydroxy: 13.2 ng/mL — ABNORMAL LOW (ref 30.0–100.0)

## 2022-04-03 LAB — CBC WITH DIFFERENTIAL/PLATELET
Basophils Absolute: 0 10*3/uL (ref 0.0–0.2)
Basos: 1 %
EOS (ABSOLUTE): 0.2 10*3/uL (ref 0.0–0.4)
Eos: 3 %
Hematocrit: 41.2 % (ref 37.5–51.0)
Hemoglobin: 14.3 g/dL (ref 13.0–17.7)
Immature Grans (Abs): 0 10*3/uL (ref 0.0–0.1)
Immature Granulocytes: 0 %
Lymphocytes Absolute: 2.3 10*3/uL (ref 0.7–3.1)
Lymphs: 33 %
MCH: 33.9 pg — ABNORMAL HIGH (ref 26.6–33.0)
MCHC: 34.7 g/dL (ref 31.5–35.7)
MCV: 98 fL — ABNORMAL HIGH (ref 79–97)
Monocytes Absolute: 0.7 10*3/uL (ref 0.1–0.9)
Monocytes: 9 %
Neutrophils Absolute: 3.7 10*3/uL (ref 1.4–7.0)
Neutrophils: 54 %
Platelets: 303 10*3/uL (ref 150–450)
RBC: 4.22 x10E6/uL (ref 4.14–5.80)
RDW: 10.9 % — ABNORMAL LOW (ref 11.6–15.4)
WBC: 6.9 10*3/uL (ref 3.4–10.8)

## 2022-04-03 LAB — TSH+FREE T4
Free T4: 1.1 ng/dL (ref 0.82–1.77)
TSH: 1.9 u[IU]/mL (ref 0.450–4.500)

## 2022-04-03 LAB — HEMOGLOBIN A1C
Est. average glucose Bld gHb Est-mCnc: 114 mg/dL
Hgb A1c MFr Bld: 5.6 % (ref 4.8–5.6)

## 2022-04-04 ENCOUNTER — Telehealth: Payer: Self-pay | Admitting: Family Medicine

## 2022-04-04 ENCOUNTER — Other Ambulatory Visit: Payer: Self-pay | Admitting: Family Medicine

## 2022-04-04 DIAGNOSIS — N529 Male erectile dysfunction, unspecified: Secondary | ICD-10-CM

## 2022-04-04 DIAGNOSIS — E785 Hyperlipidemia, unspecified: Secondary | ICD-10-CM

## 2022-04-04 DIAGNOSIS — E559 Vitamin D deficiency, unspecified: Secondary | ICD-10-CM

## 2022-04-04 LAB — ANA: Anti Nuclear Antibody (ANA): NEGATIVE

## 2022-04-04 LAB — C-REACTIVE PROTEIN: CRP: 2 mg/L (ref 0–10)

## 2022-04-04 MED ORDER — ROSUVASTATIN CALCIUM 10 MG PO TABS: 10.0000 mg | ORAL_TABLET | Freq: Every day | ORAL | 3 refills | Status: AC

## 2022-04-04 MED ORDER — VITAMIN D (ERGOCALCIFEROL) 1.25 MG (50000 UNIT) PO CAPS: 50000.0000 [IU] | ORAL_CAPSULE | ORAL | 1 refills | Status: DC

## 2022-04-04 MED ORDER — SILDENAFIL CITRATE 25 MG PO TABS: 25.0000 mg | ORAL_TABLET | ORAL | 0 refills | Status: DC | PRN

## 2022-04-04 NOTE — Progress Notes (Signed)
Please inform the patient that I've sent him medications ( rosuvastatin) for his cholesterol and Vit D once weekly supplement.   I recommend a low carb and fat diet.  There were no auto antibiotics found in his labs to indicate that he had an autoimmune disease.

## 2022-04-04 NOTE — Telephone Encounter (Signed)
Pt informed of labs

## 2022-04-04 NOTE — Telephone Encounter (Signed)
Please call patient regarding Labs.

## 2022-04-04 NOTE — Progress Notes (Unsigned)
The 10-year ASCVD risk score (Arnett DK, et al., 2019) is: 8.1%   Values used to calculate the score:     Age: 56 years     Sex: Male     Is Non-Hispanic African American: Yes     Diabetic: No     Tobacco smoker: No     Systolic Blood Pressure: 142 mmHg     Is BP treated: No     HDL Cholesterol: 35 mg/dL     Total Cholesterol: 195 mg/dL

## 2022-04-19 ENCOUNTER — Telehealth: Payer: Self-pay | Admitting: Family Medicine

## 2022-04-19 ENCOUNTER — Other Ambulatory Visit: Payer: Self-pay | Admitting: Family Medicine

## 2022-04-26 ENCOUNTER — Other Ambulatory Visit: Payer: Self-pay | Admitting: Family Medicine

## 2022-04-27 ENCOUNTER — Other Ambulatory Visit: Payer: Self-pay

## 2022-04-27 DIAGNOSIS — N529 Male erectile dysfunction, unspecified: Secondary | ICD-10-CM

## 2022-05-07 ENCOUNTER — Telehealth: Payer: Self-pay | Admitting: Family Medicine

## 2022-05-07 NOTE — Telephone Encounter (Signed)
Pt called with some questions in regards to sleep apnea diagnosis

## 2022-05-08 ENCOUNTER — Telehealth: Payer: Self-pay | Admitting: Family Medicine

## 2022-05-08 NOTE — Telephone Encounter (Signed)
Left vm asking for a return call.

## 2022-05-08 NOTE — Telephone Encounter (Signed)
Pt returning call

## 2022-05-08 NOTE — Telephone Encounter (Signed)
Pt states his wife has witnessed him stop breathing and has tried to wake him during his sleep and he doesn't feel her hitting him or trying to wake him he is worried because he was diagnosed with severe sleep apnea and hasnt heard from [facility] about the sleep machine, I gave him the phone number so he can call and check on the status last time I called I was told there was no time frame as to how long it will take for him to receive his machine. He would like advice as to what to do while he waits for machine?

## 2022-05-09 NOTE — Telephone Encounter (Signed)
Left a vm.

## 2022-06-06 ENCOUNTER — Telehealth: Payer: Self-pay

## 2022-06-06 ENCOUNTER — Other Ambulatory Visit: Payer: Self-pay

## 2022-06-06 DIAGNOSIS — G4730 Sleep apnea, unspecified: Secondary | ICD-10-CM

## 2022-06-06 NOTE — Telephone Encounter (Signed)
Returned call, spoke to pt.  ?

## 2022-06-06 NOTE — Telephone Encounter (Signed)
Patient returning nurse call. Please return patient call.

## 2022-06-06 NOTE — Telephone Encounter (Signed)
Returned call

## 2022-06-06 NOTE — Telephone Encounter (Signed)
Left vm informing pt order has been put in, there was confusion about protocol to follow on how cpap machine should be ordered for pt, community message has been sent to [facility].

## 2022-06-06 NOTE — Telephone Encounter (Signed)
Patient called in regard to CPAP machine.  Patient states he still has not received machine and is having complications in regards. Patient has been waiting on machine x1 month.  Wants a call back.

## 2022-06-10 ENCOUNTER — Other Ambulatory Visit: Payer: Self-pay | Admitting: Family Medicine

## 2022-06-10 DIAGNOSIS — E559 Vitamin D deficiency, unspecified: Secondary | ICD-10-CM

## 2022-06-27 ENCOUNTER — Telehealth: Payer: Self-pay | Admitting: Family Medicine

## 2022-06-27 NOTE — Telephone Encounter (Signed)
Pt called stating he is wanting to speak to Malachi Bonds in regards to some questions to his sleep apnea?? Can you please call pt??

## 2022-06-27 NOTE — Telephone Encounter (Signed)
Returned call, left vm.

## 2022-06-28 NOTE — Telephone Encounter (Signed)
Spoke to pt, called [facility], awaiting for a form that they will fax over to be signed by provider in order to proceed with pts order for cpap machine.

## 2022-06-28 NOTE — Telephone Encounter (Signed)
Pt informed about form being signed and faxed back to [facility], told him to be expecting a call from them soon to pick up his cpap machine.

## 2022-07-23 ENCOUNTER — Ambulatory Visit (INDEPENDENT_AMBULATORY_CARE_PROVIDER_SITE_OTHER): Payer: 59 | Admitting: Family Medicine

## 2022-07-23 ENCOUNTER — Encounter: Payer: Self-pay | Admitting: Family Medicine

## 2022-07-23 VITALS — BP 140/62 | HR 82 | Ht 74.0 in | Wt 247.0 lb

## 2022-07-23 DIAGNOSIS — Z2821 Immunization not carried out because of patient refusal: Secondary | ICD-10-CM | POA: Diagnosis not present

## 2022-07-23 DIAGNOSIS — R7301 Impaired fasting glucose: Secondary | ICD-10-CM | POA: Diagnosis not present

## 2022-07-23 DIAGNOSIS — G4730 Sleep apnea, unspecified: Secondary | ICD-10-CM

## 2022-07-23 DIAGNOSIS — E559 Vitamin D deficiency, unspecified: Secondary | ICD-10-CM

## 2022-07-23 DIAGNOSIS — I10 Essential (primary) hypertension: Secondary | ICD-10-CM

## 2022-07-23 DIAGNOSIS — Z23 Encounter for immunization: Secondary | ICD-10-CM | POA: Diagnosis not present

## 2022-07-23 DIAGNOSIS — N529 Male erectile dysfunction, unspecified: Secondary | ICD-10-CM

## 2022-07-23 MED ORDER — LISINOPRIL 10 MG PO TABS: 10.0000 mg | ORAL_TABLET | Freq: Every day | ORAL | 1 refills | Status: DC

## 2022-07-23 MED ORDER — SILDENAFIL CITRATE 25 MG PO TABS: 25.0000 mg | ORAL_TABLET | ORAL | 0 refills | Status: DC | PRN

## 2022-07-23 NOTE — Assessment & Plan Note (Signed)
Refilled Viagra 25 mg The patient requested to increase his dose to 50 mg informed the patient that I will increase his dose once his BP is well controlled Encouraged to continue Viagra 25 mg

## 2022-07-23 NOTE — Assessment & Plan Note (Signed)
Patient educated on CDC recommendation for the Varicella vaccine. Verbal consent was obtained from the patient, vaccine administered by nurse, no sign of adverse reactions noted at this time. Patient education on arm soreness and use of tylenol or ibuprofen for this patient  was discussed. Patient educated on the signs and symptoms of adverse effect and advise to contact the office if they occur.

## 2022-07-23 NOTE — Progress Notes (Addendum)
Established Patient Office Visit  Subjective:  Patient ID: Jeffery Cabrera, male    DOB: Nov 18, 1965  Age: 56 y.o. MRN: [id number]  CC:  Chief Complaint  Patient presents with   Follow-up    HPI Jeffery Cabrera is a 56 y.o. male with past medical history of hypertension, ED, and sleep apnea presents for f/u of  chronic medical conditions. Hypertension: Uncontrolled. He reports occasional headaches and denies dizziness, chest pain, and palpitation. He admits to a high-sodium diet, and reports increased physical activities. ED: requested a refill of his Viagra 25 mg.  Sleep apnea: reports to be doing well with his CPAP machine. He reports that his nighttime sleep has improved with minimum daytime somnolence.   Past Medical History:  Diagnosis Date   Pneumonia     History reviewed. No pertinent surgical history.  History reviewed. No pertinent family history.  Social History   Socioeconomic History   Marital status: Legally Separated    Spouse name: Not on file   Number of children: 2   Years of education: Not on file   Highest education level: Not on file  Occupational History   Occupation: customer service mgr.  Tobacco Use   Smoking status: Never   Smokeless tobacco: Never  Vaping Use   Vaping Use: Never used  Substance and Sexual Activity   Alcohol use: Not Currently   Drug use: No   Sexual activity: Yes  Other Topics Concern   Not on file  Social History Narrative   Not on file   Social Determinants of Health   Financial Resource Strain: Not on file  Food Insecurity: Not on file  Transportation Needs: Not on file  Physical Activity: Not on file  Stress: Not on file  Social Connections: Not on file  Intimate Partner Violence: Not on file    Outpatient Medications Prior to Visit  Medication Sig Dispense Refill   albuterol (VENTOLIN HFA) 108 (90 Base) MCG/ACT inhaler Inhale 2 puffs into the lungs every 6 (six) hours as needed for wheezing or  shortness of breath. 18 g 3   fluticasone furoate-vilanterol (BREO ELLIPTA) 100-25 MCG/INH AEPB Inhale 1 puff into the lungs daily. 28 each 0   ibuprofen (ADVIL) 200 MG tablet Take 200 mg by mouth every 6 (six) hours as needed.     oseltamivir (TAMIFLU) 75 MG capsule Take 1 capsule (75 mg total) by mouth every 12 (twelve) hours. 10 capsule 0   pentoxifylline (TRENTAL) 400 MG CR tablet Take 1 tablet (400 mg total) by mouth in the morning and at bedtime. 60 tablet 2   rosuvastatin (CRESTOR) 10 MG tablet Take 1 tablet (10 mg total) by mouth daily. 90 tablet 3   Vitamin D, Ergocalciferol, (DRISDOL) 1.25 MG (50000 UNIT) CAPS capsule TAKE 1 CAPSULE BY MOUTH EVERY 7 DAYS 5 capsule 1   sildenafil (VIAGRA) 25 MG tablet Take 1 tablet (25 mg total) by mouth as needed for erectile dysfunction. 10 tablet 0   No facility-administered medications prior to visit.    No Known Allergies  ROS Review of Systems  Constitutional:  Negative for fatigue and fever.  Respiratory:  Negative for chest tightness and shortness of breath.   Cardiovascular:  Negative for chest pain and palpitations.  Neurological:  Negative for dizziness and headaches.  Psychiatric/Behavioral:  Negative for self-injury, sleep disturbance and suicidal ideas.       Objective:    Physical Exam HENT:     Head: Normocephalic.  Right Ear: External ear normal.     Left Ear: External ear normal.  Cardiovascular:     Rate and Rhythm: Normal rate and regular rhythm.     Pulses: Normal pulses.     Heart sounds: Normal heart sounds.  Pulmonary:     Effort: Pulmonary effort is normal.     Breath sounds: Normal breath sounds.  Neurological:     Mental Status: He is alert.     BP (!) 140/62 (BP Location: Left Arm, Cuff Size: Large)   Pulse 82   Ht 6' 2"  (1.88 m)   Wt 247 lb (112 kg)   SpO2 98%   BMI 31.71 kg/m  Wt Readings from Last 3 Encounters:  07/23/22 247 lb (112 kg)  03/22/22 249 lb 9.6 oz (113.2 kg)  10/11/21 245 lb  (111.1 kg)    Lab Results  Component Value Date   TSH 1.900 04/02/2022   Lab Results  Component Value Date   WBC 6.9 04/02/2022   HGB 14.3 04/02/2022   HCT 41.2 04/02/2022   MCV 98 (H) 04/02/2022   PLT 303 04/02/2022   Lab Results  Component Value Date   NA 144 04/02/2022   K 4.5 04/02/2022   CO2 22 04/02/2022   GLUCOSE 121 (H) 04/02/2022   BUN 13 04/02/2022   CREATININE 1.24 04/02/2022   BILITOT 0.5 04/02/2022   ALKPHOS 66 04/02/2022   AST 22 04/02/2022   ALT 23 04/02/2022   PROT 7.2 04/02/2022   ALBUMIN 4.3 04/02/2022   CALCIUM 9.3 04/02/2022   ANIONGAP 10 03/31/2020   EGFR 70 04/02/2022   Lab Results  Component Value Date   CHOL 195 04/02/2022   Lab Results  Component Value Date   HDL 35 (L) 04/02/2022   Lab Results  Component Value Date   LDLCALC 146 (H) 04/02/2022   Lab Results  Component Value Date   TRIG 74 04/02/2022   Lab Results  Component Value Date   CHOLHDL 5.6 (H) 04/02/2022   Lab Results  Component Value Date   HGBA1C 5.6 04/02/2022      Assessment & Plan:   Problem List Items Addressed This Visit       Cardiovascular and Mediastinum   Essential hypertension - Primary    Uncontrolled He reports occasional headaches and denies dizziness, chest pain, and palpitation He admits to a high-sodium diet and reports increased physical activities Will start the patient on antihypertensive today Encouraged to take medication at the same time daily  Advised on symptomatic hypotension Advised to return for labs in 2 weeks to assess potassium and creatinine levels Dash diet reviewed Encouraged to decrease his sodium intake to less than 1500 mg daily        Relevant Medications   lisinopril (ZESTRIL) 10 MG tablet   sildenafil (VIAGRA) 25 MG tablet     Respiratory   Sleep apnea    reports to be doing well with his CPAP machine He reports that his nighttime sleep has improved with minimum daytime somnolence        Other    Erectile dysfunction    Refilled Viagra 25 mg The patient requested to increase his dose to 50 mg informed the patient that I will increase his dose once his BP is well controlled Encouraged to continue Viagra 25 mg      Relevant Medications   sildenafil (VIAGRA) 25 MG tablet   Need for viral immunization    Patient educated on CDC recommendation for  the Varicella vaccine. Verbal consent was obtained from the patient, vaccine administered by nurse, no sign of adverse reactions noted at this time. Patient education on arm soreness and use of tylenol or ibuprofen for this patient  was discussed. Patient educated on the signs and symptoms of adverse effect and advise to contact the office if they occur.      Other Visit Diagnoses     IFG (impaired fasting glucose)       Relevant Orders   CBC with Differential/Platelet   CMP14+EGFR   TSH + free T4   Hemoglobin A1C   Lipid panel   Vitamin D deficiency       Relevant Orders   Vitamin D (25 hydroxy)   Refused influenza vaccine       Immunization due       Relevant Orders   Varicella-zoster vaccine IM (Completed)       Meds ordered this encounter  Medications   lisinopril (ZESTRIL) 10 MG tablet    Sig: Take 1 tablet (10 mg total) by mouth daily.    Dispense:  30 tablet    Refill:  1   sildenafil (VIAGRA) 25 MG tablet    Sig: Take 1 tablet (25 mg total) by mouth as needed for erectile dysfunction.    Dispense:  10 tablet    Refill:  0    Follow-up: Return in about 1 month (around 08/22/2022) for BP.    Alvira Monday, FNP

## 2022-07-23 NOTE — Assessment & Plan Note (Signed)
Uncontrolled He reports occasional headaches and denies dizziness, chest pain, and palpitation He admits to a high-sodium diet and reports increased physical activities Will start the patient on antihypertensive today Encouraged to take medication at the same time daily  Advised on symptomatic hypotension Advised to return for labs in 2 weeks to assess potassium and creatinine levels Dash diet reviewed Encouraged to decrease his sodium intake to less than 1500 mg daily

## 2022-07-23 NOTE — Assessment & Plan Note (Signed)
reports to be doing well with his CPAP machine He reports that his nighttime sleep has improved with minimum daytime somnolence

## 2022-07-23 NOTE — Patient Instructions (Addendum)
I appreciate the opportunity to provide care to you today!    Follow up:  1 months  Labs: please stop by the lab in 2 weeks to get your blood drawn (CBC, CMP, TSH, Lipid profile, HgA1c, Vit D)  Please contact Gastroenterology to have Colonoscopy scheduled.    Lisinopril 10 mg daily  Take at the same time each day, with or without food Elevated blood pressure often has no symptoms; however, keeping blood pressure within goal ranges over time reduces the risk of major cardiovascular events. Symptomatic hypotension (e.g. light-headedness/ dizziness) after starting therapy is expected  in the first few days of therapy. This is due to your body adjusting to a lower pressure  SE: dry, persistent cough     Please continue to a heart-healthy diet and increase your physical activities. Try to exercise for 10mins at least three times a week.      It was a pleasure to see you and I look forward to continuing to work together on your health and well-being. Please do not hesitate to call the office if you need care or have questions about your care.   Have a wonderful day and week. With Gratitude, Alvira Monday MSN, FNP-BC

## 2022-08-06 ENCOUNTER — Encounter: Payer: Self-pay | Admitting: Family Medicine

## 2022-08-06 ENCOUNTER — Ambulatory Visit (INDEPENDENT_AMBULATORY_CARE_PROVIDER_SITE_OTHER): Payer: 59 | Admitting: Family Medicine

## 2022-08-06 VITALS — BP 144/82 | HR 84 | Ht 74.0 in | Wt 243.0 lb

## 2022-08-06 DIAGNOSIS — I10 Essential (primary) hypertension: Secondary | ICD-10-CM | POA: Diagnosis not present

## 2022-08-06 MED ORDER — LISINOPRIL 20 MG PO TABS: 20.0000 mg | ORAL_TABLET | Freq: Every day | ORAL | 1 refills | Status: DC

## 2022-08-06 NOTE — Patient Instructions (Signed)
I appreciate the opportunity to provide care to you today!    Follow up:  1 months  Please pick up your medication at the pharmacy   Please continue to a heart-healthy diet and increase your physical activities. Try to exercise for 30mins at least three times a week.      It was a pleasure to see you and I look forward to continuing to work together on your health and well-being. Please do not hesitate to call the office if you need care or have questions about your care.   Have a wonderful day and week. With Gratitude, Kerrington Sova MSN, FNP-BC  

## 2022-08-06 NOTE — Progress Notes (Signed)
Established Patient Office Visit  Subjective:  Patient ID: Jeffery Cabrera, male    DOB: 01/16/1966  Age: 56 y.o. MRN: [id number]  CC:  Chief Complaint  Patient presents with   Follow-up    Pt did receive cpap machice in September 2023.    HPI Jeffery Cabrera is a 56 y.o. male with past medical history of hypertension, and sleep apnea presents for f/u of  chronic medical conditions.  Hypertension: He takes lisinopril 10 mg daily.  He denies headaches, chest pain, visual disturbances, palpitations, and shortness of breath.  He reports changes to his diet, noting to decrease his sodium intake and increase his physical activities.    Past Medical History:  Diagnosis Date   Pneumonia     History reviewed. No pertinent surgical history.  History reviewed. No pertinent family history.  Social History   Socioeconomic History   Marital status: Legally Separated    Spouse name: Not on file   Number of children: 2   Years of education: Not on file   Highest education level: Not on file  Occupational History   Occupation: customer service mgr.  Tobacco Use   Smoking status: Never   Smokeless tobacco: Never  Vaping Use   Vaping Use: Never used  Substance and Sexual Activity   Alcohol use: Not Currently   Drug use: No   Sexual activity: Yes  Other Topics Concern   Not on file  Social History Narrative   Not on file   Social Determinants of Health   Financial Resource Strain: Not on file  Food Insecurity: Not on file  Transportation Needs: Not on file  Physical Activity: Not on file  Stress: Not on file  Social Connections: Not on file  Intimate Partner Violence: Not on file    Outpatient Medications Prior to Visit  Medication Sig Dispense Refill   albuterol (VENTOLIN HFA) 108 (90 Base) MCG/ACT inhaler Inhale 2 puffs into the lungs every 6 (six) hours as needed for wheezing or shortness of breath. 18 g 3   fluticasone furoate-vilanterol (BREO ELLIPTA)  100-25 MCG/INH AEPB Inhale 1 puff into the lungs daily. 28 each 0   ibuprofen (ADVIL) 200 MG tablet Take 200 mg by mouth every 6 (six) hours as needed.     oseltamivir (TAMIFLU) 75 MG capsule Take 1 capsule (75 mg total) by mouth every 12 (twelve) hours. 10 capsule 0   pentoxifylline (TRENTAL) 400 MG CR tablet Take 1 tablet (400 mg total) by mouth in the morning and at bedtime. 60 tablet 2   rosuvastatin (CRESTOR) 10 MG tablet Take 1 tablet (10 mg total) by mouth daily. 90 tablet 3   sildenafil (VIAGRA) 25 MG tablet Take 1 tablet (25 mg total) by mouth as needed for erectile dysfunction. 10 tablet 0   Vitamin D, Ergocalciferol, (DRISDOL) 1.25 MG (50000 UNIT) CAPS capsule TAKE 1 CAPSULE BY MOUTH EVERY 7 DAYS 5 capsule 1   lisinopril (ZESTRIL) 10 MG tablet Take 1 tablet (10 mg total) by mouth daily. 30 tablet 1   No facility-administered medications prior to visit.    No Known Allergies  ROS Review of Systems  Constitutional:  Negative for fatigue and fever.  Eyes:  Negative for visual disturbance.  Respiratory:  Negative for chest tightness and shortness of breath.   Cardiovascular:  Negative for chest pain and palpitations.  Neurological:  Negative for dizziness and headaches.      Objective:    Physical Exam HENT:  Head: Normocephalic.  Cardiovascular:     Rate and Rhythm: Normal rate and regular rhythm.     Pulses: Normal pulses.     Heart sounds: Normal heart sounds.  Pulmonary:     Effort: Pulmonary effort is normal.     Breath sounds: Normal breath sounds.  Musculoskeletal:     Cervical back: No rigidity.  Neurological:     Mental Status: He is alert.     BP (!) 144/82 (BP Location: Left Arm)   Pulse 84   Ht 6' 2"  (1.88 m)   Wt 243 lb (110.2 kg)   SpO2 94%   BMI 31.20 kg/m  Wt Readings from Last 3 Encounters:  08/06/22 243 lb (110.2 kg)  07/23/22 247 lb (112 kg)  03/22/22 249 lb 9.6 oz (113.2 kg)    Lab Results  Component Value Date   TSH 1.900  04/02/2022   Lab Results  Component Value Date   WBC 6.9 04/02/2022   HGB 14.3 04/02/2022   HCT 41.2 04/02/2022   MCV 98 (H) 04/02/2022   PLT 303 04/02/2022   Lab Results  Component Value Date   NA 144 04/02/2022   K 4.5 04/02/2022   CO2 22 04/02/2022   GLUCOSE 121 (H) 04/02/2022   BUN 13 04/02/2022   CREATININE 1.24 04/02/2022   BILITOT 0.5 04/02/2022   ALKPHOS 66 04/02/2022   AST 22 04/02/2022   ALT 23 04/02/2022   PROT 7.2 04/02/2022   ALBUMIN 4.3 04/02/2022   CALCIUM 9.3 04/02/2022   ANIONGAP 10 03/31/2020   EGFR 70 04/02/2022   Lab Results  Component Value Date   CHOL 195 04/02/2022   Lab Results  Component Value Date   HDL 35 (L) 04/02/2022   Lab Results  Component Value Date   LDLCALC 146 (H) 04/02/2022   Lab Results  Component Value Date   TRIG 74 04/02/2022   Lab Results  Component Value Date   CHOLHDL 5.6 (H) 04/02/2022   Lab Results  Component Value Date   HGBA1C 5.6 04/02/2022      Assessment & Plan:   Problem List Items Addressed This Visit       Cardiovascular and Mediastinum   Essential hypertension - Primary    Uncontrolled Will discontinue lisinopril 10 mg Will start patient on lisinopril 20 mg daily Encourage patient to check his blood pressure at home daily and bring the readings to his next appointment He denies headaches, dizziness, blurred vision, chest pain,and palpitations Encouraged to continue Dash eating plan and increase his physical activities Will follow up on his blood pressure in 4 weeks If BP is uncontrolled ,  will add hydrochlorothiazide 12.5 to his treatment regimen      Relevant Medications   lisinopril (ZESTRIL) 20 MG tablet    Meds ordered this encounter  Medications   lisinopril (ZESTRIL) 20 MG tablet    Sig: Take 1 tablet (20 mg total) by mouth daily.    Dispense:  30 tablet    Refill:  1    Follow-up: Return in about 1 month (around 09/06/2022).    Alvira Monday, FNP

## 2022-08-06 NOTE — Assessment & Plan Note (Addendum)
Uncontrolled Will discontinue lisinopril 10 mg Will start patient on lisinopril 20 mg daily Encourage patient to check his blood pressure at home daily and bring the readings to his next appointment He denies headaches, dizziness, blurred vision, chest pain,and palpitations Encouraged to continue Dash eating plan and increase his physical activities Will follow up on his blood pressure in 4 weeks If BP is uncontrolled ,  will add hydrochlorothiazide 12.5 to his treatment regimen

## 2022-09-07 ENCOUNTER — Ambulatory Visit: Payer: 59 | Admitting: Family Medicine

## 2022-09-24 ENCOUNTER — Encounter: Payer: Self-pay | Admitting: Family Medicine

## 2022-09-24 ENCOUNTER — Ambulatory Visit (INDEPENDENT_AMBULATORY_CARE_PROVIDER_SITE_OTHER): Payer: 59 | Admitting: Family Medicine

## 2022-09-24 VITALS — BP 138/90 | HR 90 | Ht 74.0 in | Wt 246.0 lb

## 2022-09-24 DIAGNOSIS — N529 Male erectile dysfunction, unspecified: Secondary | ICD-10-CM | POA: Diagnosis not present

## 2022-09-24 DIAGNOSIS — I10 Essential (primary) hypertension: Secondary | ICD-10-CM | POA: Diagnosis not present

## 2022-09-24 MED ORDER — SILDENAFIL CITRATE 25 MG PO TABS: 25.0000 mg | ORAL_TABLET | ORAL | 0 refills | Status: DC | PRN

## 2022-09-24 MED ORDER — LISINOPRIL-HYDROCHLOROTHIAZIDE 20-12.5 MG PO TABS: 1.0000 | ORAL_TABLET | Freq: Every day | ORAL | 1 refills | Status: DC

## 2022-09-24 NOTE — Assessment & Plan Note (Signed)
Refilled Viagra 25 mg as needed 

## 2022-09-24 NOTE — Patient Instructions (Signed)
I appreciate the opportunity to provide care to you today!    Follow up:  1 months  Please pick up your new prescription  at the pharmacy and start taking   Please continue to a heart-healthy diet and increase your physical activities. Try to exercise for at least three times a week.      It was a pleasure to see you and I look forward to continuing to work together on your health and well-being. Please do not hesitate to call the office if you need care or have questions about your care.   Have a wonderful day and week. With Gratitude, Gilmore Laroche MSN, FNP-BC

## 2022-09-24 NOTE — Assessment & Plan Note (Signed)
Uncontrolled Will discontinue lisinopril 20 mg today We will start patient on lisinopril hydrochlorothiazide 20-12.5 Encouraged to continue low-sodium diet with increased physical activities Follow-up in 4 weeks BP Readings from Last 3 Encounters:  09/24/22 (!) 138/90  08/06/22 (!) 144/82  07/23/22 (!) 140/62

## 2022-09-24 NOTE — Progress Notes (Signed)
Established Patient Office Visit  Subjective:  Patient ID: Jeffery Cabrera, male    DOB: Jun 03, 1966  Age: 56 y.o. MRN: [id number]  CC:  Chief Complaint  Patient presents with   Follow-up    Pt reports feeling well.     HPI Jeffery Cabrera is a 56 y.o. male with past medical history of hypertension, sleep apnea, erectile dysfunction presents for f/u of  chronic medical conditions.  Hypertension: He takes lisinopril 20 mg daily.  He denies chest pain, palpitation, shortness of breath, and chest pain.  He reports compliance with treatment regimen.  Erectile dysfunction: He reports needing a refill on his Viagra 25 mg.   Past Medical History:  Diagnosis Date   Pneumonia     History reviewed. No pertinent surgical history.  History reviewed. No pertinent family history.  Social History   Socioeconomic History   Marital status: Legally Separated    Spouse name: Not on file   Number of children: 2   Years of education: Not on file   Highest education level: Not on file  Occupational History   Occupation: customer service mgr.  Tobacco Use   Smoking status: Never   Smokeless tobacco: Never  Vaping Use   Vaping Use: Never used  Substance and Sexual Activity   Alcohol use: Not Currently   Drug use: No   Sexual activity: Yes  Other Topics Concern   Not on file  Social History Narrative   Not on file   Social Determinants of Health   Financial Resource Strain: Not on file  Food Insecurity: Not on file  Transportation Needs: Not on file  Physical Activity: Not on file  Stress: Not on file  Social Connections: Not on file  Intimate Partner Violence: Not on file    Outpatient Medications Prior to Visit  Medication Sig Dispense Refill   rosuvastatin (CRESTOR) 10 MG tablet Take 1 tablet (10 mg total) by mouth daily. 90 tablet 3   Vitamin D, Ergocalciferol, (DRISDOL) 1.25 MG (50000 UNIT) CAPS capsule TAKE 1 CAPSULE BY MOUTH EVERY 7 DAYS 5 capsule 1    lisinopril (ZESTRIL) 20 MG tablet Take 1 tablet (20 mg total) by mouth daily. 30 tablet 1   sildenafil (VIAGRA) 25 MG tablet Take 1 tablet (25 mg total) by mouth as needed for erectile dysfunction. 10 tablet 0   albuterol (VENTOLIN HFA) 108 (90 Base) MCG/ACT inhaler Inhale 2 puffs into the lungs every 6 (six) hours as needed for wheezing or shortness of breath. (Patient not taking: Reported on 09/24/2022) 18 g 3   fluticasone furoate-vilanterol (BREO ELLIPTA) 100-25 MCG/INH AEPB Inhale 1 puff into the lungs daily. 28 each 0   ibuprofen (ADVIL) 200 MG tablet Take 200 mg by mouth every 6 (six) hours as needed.     oseltamivir (TAMIFLU) 75 MG capsule Take 1 capsule (75 mg total) by mouth every 12 (twelve) hours. 10 capsule 0   pentoxifylline (TRENTAL) 400 MG CR tablet Take 1 tablet (400 mg total) by mouth in the morning and at bedtime. 60 tablet 2   No facility-administered medications prior to visit.    No Known Allergies  ROS Review of Systems  Constitutional:  Negative for fatigue and fever.  Eyes:  Negative for visual disturbance.  Respiratory:  Negative for chest tightness and shortness of breath.   Cardiovascular:  Negative for chest pain and palpitations.  Neurological:  Negative for dizziness and headaches.      Objective:    Physical  Exam HENT:     Head: Normocephalic.     Right Ear: External ear normal.     Left Ear: External ear normal.  Cardiovascular:     Rate and Rhythm: Normal rate and regular rhythm.     Pulses: Normal pulses.     Heart sounds: Normal heart sounds.  Pulmonary:     Effort: Pulmonary effort is normal.     Breath sounds: Normal breath sounds.  Musculoskeletal:     Cervical back: No rigidity.  Neurological:     Mental Status: He is alert.     BP (!) 138/90 (BP Location: Left Arm)   Pulse 90   Ht _0  (1.88 m)   Wt 246 lb (111.6 kg)   SpO2 94%   BMI 31.58 kg/m  Wt Readings from Last 3 Encounters:  09/24/22 246 lb (111.6 kg)  08/06/22 243  lb (110.2 kg)  07/23/22 247 lb (112 kg)    Lab Results  Component Value Date   TSH 1.900 04/02/2022   Lab Results  Component Value Date   WBC 6.9 04/02/2022   HGB 14.3 04/02/2022   HCT 41.2 04/02/2022   MCV 98 (H) 04/02/2022   PLT 303 04/02/2022   Lab Results  Component Value Date   NA 144 04/02/2022   K 4.5 04/02/2022   CO2 22 04/02/2022   GLUCOSE 121 (H) 04/02/2022   BUN 13 04/02/2022   CREATININE 1.24 04/02/2022   BILITOT 0.5 04/02/2022   ALKPHOS 66 04/02/2022   AST 22 04/02/2022   ALT 23 04/02/2022   PROT 7.2 04/02/2022   ALBUMIN 4.3 04/02/2022   CALCIUM 9.3 04/02/2022   ANIONGAP 10 03/31/2020   EGFR 70 04/02/2022   Lab Results  Component Value Date   CHOL 195 04/02/2022   Lab Results  Component Value Date   HDL 35 (L) 04/02/2022   Lab Results  Component Value Date   LDLCALC 146 (H) 04/02/2022   Lab Results  Component Value Date   TRIG 74 04/02/2022   Lab Results  Component Value Date   CHOLHDL 5.6 (H) 04/02/2022   Lab Results  Component Value Date   HGBA1C 5.6 04/02/2022      Assessment & Plan:  Essential hypertension Assessment & Plan: Uncontrolled Will discontinue lisinopril 20 mg today We will start patient on lisinopril hydrochlorothiazide 20-12.5 Encouraged to continue low-sodium diet with increased physical activities Follow-up in 4 weeks BP Readings from Last 3 Encounters:  09/24/22 (!) 138/90  08/06/22 (!) 144/82  07/23/22 (!) 140/62     Orders: -     Lisinopril-hydroCHLOROthiazide; Take 1 tablet by mouth daily.  Dispense: 30 tablet; Refill: 1  Erectile dysfunction, unspecified erectile dysfunction type Assessment & Plan: Refilled Viagra 25 mg as needed  Orders: -     Sildenafil Citrate; Take 1 tablet (25 mg total) by mouth as needed for erectile dysfunction.  Dispense: 30 tablet; Refill: 0    Follow-up: Return in about 1 month (around 10/24/2022) for BP.   Alvira Monday, FNP

## 2022-09-30 ENCOUNTER — Other Ambulatory Visit: Payer: Self-pay | Admitting: Family Medicine

## 2022-09-30 DIAGNOSIS — I10 Essential (primary) hypertension: Secondary | ICD-10-CM

## 2022-10-24 ENCOUNTER — Encounter: Payer: Self-pay | Admitting: Family Medicine

## 2022-10-24 ENCOUNTER — Ambulatory Visit (INDEPENDENT_AMBULATORY_CARE_PROVIDER_SITE_OTHER): Payer: 59 | Admitting: Family Medicine

## 2022-10-24 VITALS — BP 148/90 | HR 78 | Ht 74.0 in | Wt 246.1 lb

## 2022-10-24 DIAGNOSIS — N529 Male erectile dysfunction, unspecified: Secondary | ICD-10-CM

## 2022-10-24 DIAGNOSIS — I10 Essential (primary) hypertension: Secondary | ICD-10-CM | POA: Diagnosis not present

## 2022-10-24 MED ORDER — TELMISARTAN 40 MG PO TABS: 40.0000 mg | ORAL_TABLET | Freq: Every day | ORAL | 1 refills | Status: DC

## 2022-10-24 MED ORDER — SILDENAFIL CITRATE 25 MG PO TABS: 25.0000 mg | ORAL_TABLET | ORAL | 0 refills | Status: DC | PRN

## 2022-10-24 NOTE — Patient Instructions (Signed)
I appreciate the opportunity to provide care to you today!    Follow up:  1 months for BP  Please pick up your medication at the pharmacy and start taking tomorrow  Please continue increase physical activities with low-sodium diet  Physical activity helps: Lower your blood glucose, improve your heart health, lower your blood pressure and cholesterol, burn calories to help manage her weight, gave you energy, lower stress, and improve his sleep.  The American diabetes Association (ADA) recommends being active for 2-1/2 hours (150 minutes) or more week.  Exercise for 30 minutes, 5 days a week (150 minutes total)         It was a pleasure to see you and I look forward to continuing to work together on your health and well-being. Please do not hesitate to call the office if you need care or have questions about your care.   Have a wonderful day and week. With Gratitude, Gilmore Laroche MSN, FNP-BC

## 2022-10-24 NOTE — Assessment & Plan Note (Signed)
Uncontrolled He takes losartan hydrochlorothiazide 100-12.5 Patient complains of increased urination with hydrochlorothiazide Will discontinue current treatment regimen and start patient on telmisartan 40 mg He reports lifestyle medication with heart healthy diet and increase physical activity, noting to exercise daily for at least 30 minutes He denies headaches, dizziness, blurred vision, chest pain, palpitation Will follow up on BP in 4 weeks BP Readings from Last 3 Encounters:  10/24/22 (!) 148/90  09/24/22 (!) 138/90  08/06/22 (!) 144/82

## 2022-10-24 NOTE — Progress Notes (Signed)
Established Patient Office Visit  Subjective:  Patient ID: Jeffery Cabrera, male    DOB: [birth year]  Age: 56 y.o. MRN: [id number]  CC:  Chief Complaint  Patient presents with   Follow-up    1 month f/u needs refill on sildenafil.     HPI Jeffery Cabrera is a 56 y.o. male with past medical history of  pr essential hypertension esents for f/u of chronic medical conditions.  Past Medical History:  Diagnosis Date   Pneumonia     History reviewed. No pertinent surgical history.  History reviewed. No pertinent family history.  Social History   Socioeconomic History   Marital status: Legally Separated    Spouse name: Not on file   Number of children: 2   Years of education: Not on file   Highest education level: Not on file  Occupational History   Occupation: customer service mgr.  Tobacco Use   Smoking status: Never   Smokeless tobacco: Never  Vaping Use   Vaping Use: Never used  Substance and Sexual Activity   Alcohol use: Not Currently   Drug use: No   Sexual activity: Yes  Other Topics Concern   Not on file  Social History Narrative   Not on file   Social Determinants of Health   Financial Resource Strain: Not on file  Food Insecurity: Not on file  Transportation Needs: Not on file  Physical Activity: Not on file  Stress: Not on file  Social Connections: Not on file  Intimate Partner Violence: Not on file    Outpatient Medications Prior to Visit  Medication Sig Dispense Refill   rosuvastatin (CRESTOR) 10 MG tablet Take 1 tablet (10 mg total) by mouth daily. 90 tablet 3   Vitamin D, Ergocalciferol, (DRISDOL) 1.25 MG (50000 UNIT) CAPS capsule TAKE 1 CAPSULE BY MOUTH EVERY 7 DAYS 5 capsule 1   lisinopril-hydrochlorothiazide (ZESTORETIC) 20-12.5 MG tablet Take 1 tablet by mouth daily. 30 tablet 1   sildenafil (VIAGRA) 25 MG tablet Take 1 tablet (25 mg total) by mouth as needed for erectile dysfunction. 30 tablet 0   No facility-administered  medications prior to visit.    No Known Allergies  ROS Review of Systems  Constitutional:  Negative for fatigue and fever.  Eyes:  Negative for visual disturbance.  Respiratory:  Negative for chest tightness and shortness of breath.   Cardiovascular:  Negative for chest pain and palpitations.  Neurological:  Negative for dizziness and headaches.      Objective:    Physical Exam HENT:     Head: Normocephalic.     Right Ear: External ear normal.     Left Ear: External ear normal.     Nose: No congestion or rhinorrhea.     Mouth/Throat:     Mouth: Mucous membranes are moist.  Cardiovascular:     Rate and Rhythm: Regular rhythm.     Heart sounds: No murmur heard. Pulmonary:     Effort: No respiratory distress.     Breath sounds: Normal breath sounds.  Neurological:     Mental Status: He is alert.     BP (!) 148/90 (BP Location: Left Arm)   Pulse 78   Ht _0  (1.88 m)   Wt 246 lb 1.3 oz (111.6 kg)   SpO2 95%   BMI 31.59 kg/m  Wt Readings from Last 3 Encounters:  10/24/22 246 lb 1.3 oz (111.6 kg)  09/24/22 246 lb (111.6 kg)  08/06/22 243 lb (110.2 kg)  Lab Results  Component Value Date   TSH 1.900 04/02/2022   Lab Results  Component Value Date   WBC 6.9 04/02/2022   HGB 14.3 04/02/2022   HCT 41.2 04/02/2022   MCV 98 (H) 04/02/2022   PLT 303 04/02/2022   Lab Results  Component Value Date   NA 144 04/02/2022   K 4.5 04/02/2022   CO2 22 04/02/2022   GLUCOSE 121 (H) 04/02/2022   BUN 13 04/02/2022   CREATININE 1.24 04/02/2022   BILITOT 0.5 04/02/2022   ALKPHOS 66 04/02/2022   AST 22 04/02/2022   ALT 23 04/02/2022   PROT 7.2 04/02/2022   ALBUMIN 4.3 04/02/2022   CALCIUM 9.3 04/02/2022   ANIONGAP 10 03/31/2020   EGFR 70 04/02/2022   Lab Results  Component Value Date   CHOL 195 04/02/2022   Lab Results  Component Value Date   HDL 35 (L) 04/02/2022   Lab Results  Component Value Date   LDLCALC 146 (H) 04/02/2022   Lab Results  Component  Value Date   TRIG 74 04/02/2022   Lab Results  Component Value Date   CHOLHDL 5.6 (H) 04/02/2022   Lab Results  Component Value Date   HGBA1C 5.6 04/02/2022      Assessment & Plan:  Essential hypertension Assessment & Plan: Uncontrolled He takes losartan hydrochlorothiazide 100-12.5 Patient complains of increased urination with hydrochlorothiazide Will discontinue current treatment regimen and start patient on telmisartan 40 mg He reports lifestyle medication with heart healthy diet and increase physical activity, noting to exercise daily for at least 30 minutes He denies headaches, dizziness, blurred vision, chest pain, palpitation Will follow up on BP in 4 weeks BP Readings from Last 3 Encounters:  10/24/22 (!) 148/90  09/24/22 (!) 138/90  08/06/22 (!) 144/82     Orders: -     Telmisartan; Take 1 tablet (40 mg total) by mouth daily.  Dispense: 30 tablet; Refill: 1  Erectile dysfunction, unspecified erectile dysfunction type -     Sildenafil Citrate; Take 1 tablet (25 mg total) by mouth as needed for erectile dysfunction.  Dispense: 30 tablet; Refill: 0    Follow-up: Return in about 1 month (around 11/24/2022) for BP.   Alvira Monday, FNP

## 2022-11-21 ENCOUNTER — Encounter: Payer: Self-pay | Admitting: Family Medicine

## 2022-11-21 ENCOUNTER — Ambulatory Visit (INDEPENDENT_AMBULATORY_CARE_PROVIDER_SITE_OTHER): Payer: 59 | Admitting: Family Medicine

## 2022-11-21 VITALS — BP 138/88 | HR 87 | Ht 74.0 in | Wt 247.1 lb

## 2022-11-21 DIAGNOSIS — N529 Male erectile dysfunction, unspecified: Secondary | ICD-10-CM

## 2022-11-21 DIAGNOSIS — M542 Cervicalgia: Secondary | ICD-10-CM

## 2022-11-21 DIAGNOSIS — I10 Essential (primary) hypertension: Secondary | ICD-10-CM

## 2022-11-21 MED ORDER — BACLOFEN 10 MG PO TABS: 10.0000 mg | ORAL_TABLET | Freq: Three times a day (TID) | ORAL | 0 refills | Status: AC

## 2022-11-21 MED ORDER — GABAPENTIN 100 MG PO CAPS: 100.0000 mg | ORAL_CAPSULE | Freq: Every day | ORAL | 0 refills | Status: DC

## 2022-11-21 MED ORDER — SILDENAFIL CITRATE 25 MG PO TABS: 25.0000 mg | ORAL_TABLET | ORAL | 0 refills | Status: DC | PRN

## 2022-11-21 NOTE — Progress Notes (Unsigned)
Mri   Established Patient Office Visit  Subjective:  Patient ID: Jeffery Cabrera, male    DOB: 09-24-1966  Age: 57 y.o. MRN: [id number]  CC:  Chief Complaint  Patient presents with   Follow-up    Following up for htn, pt reports a possible pinched nerve in his neck radiating down his right arm to his pointer finger onset of this began 3 weeks ago (10/31/2022). Also reports numbness on left leg on and off onset of this began 11/21/2021 for about a year now.     HPI Jeffery Cabrera is a 57 y.o. male with past medical history of essential hypertension, erectile dysfunction, and sleep apnea presents for f/u of  chronic medical conditions. For the details of today's visit, please refer to the assessment and plan.     Past Medical History:  Diagnosis Date   Pneumonia     History reviewed. No pertinent surgical history.  History reviewed. No pertinent family history.  Social History   Socioeconomic History   Marital status: Legally Separated    Spouse name: Not on file   Number of children: 2   Years of education: Not on file   Highest education level: Not on file  Occupational History   Occupation: customer service mgr.  Tobacco Use   Smoking status: Never   Smokeless tobacco: Never  Vaping Use   Vaping Use: Never used  Substance and Sexual Activity   Alcohol use: Not Currently   Drug use: No   Sexual activity: Yes  Other Topics Concern   Not on file  Social History Narrative   Not on file   Social Determinants of Health   Financial Resource Strain: Not on file  Food Insecurity: Not on file  Transportation Needs: Not on file  Physical Activity: Not on file  Stress: Not on file  Social Connections: Not on file  Intimate Partner Violence: Not on file    Outpatient Medications Prior to Visit  Medication Sig Dispense Refill   rosuvastatin (CRESTOR) 10 MG tablet Take 1 tablet (10 mg total) by mouth daily. 90 tablet 3   telmisartan (MICARDIS) 40 MG tablet  Take 1 tablet (40 mg total) by mouth daily. 30 tablet 1   Vitamin D, Ergocalciferol, (DRISDOL) 1.25 MG (50000 UNIT) CAPS capsule TAKE 1 CAPSULE BY MOUTH EVERY 7 DAYS 5 capsule 1   sildenafil (VIAGRA) 25 MG tablet Take 1 tablet (25 mg total) by mouth as needed for erectile dysfunction. 30 tablet 0   No facility-administered medications prior to visit.    No Known Allergies  ROS Review of Systems  Constitutional:  Negative for fatigue and fever.  Eyes:  Negative for visual disturbance.  Respiratory:  Negative for chest tightness and shortness of breath.   Cardiovascular:  Negative for chest pain and palpitations.  Musculoskeletal:  Positive for neck pain. Negative for arthralgias and neck stiffness.  Neurological:  Negative for dizziness and headaches.      Objective:    Physical Exam HENT:     Head: Normocephalic.     Right Ear: External ear normal.     Left Ear: External ear normal.     Nose: No congestion or rhinorrhea.     Mouth/Throat:     Mouth: Mucous membranes are moist.  Cardiovascular:     Rate and Rhythm: Regular rhythm.     Heart sounds: No murmur heard. Pulmonary:     Effort: No respiratory distress.     Breath sounds: Normal breath  sounds.  Musculoskeletal:     Right shoulder: No swelling or tenderness. Normal range of motion. Normal strength.     Right hand: No swelling, deformity or tenderness.     Cervical back: No rigidity. No muscular tenderness. Normal range of motion.  Neurological:     Mental Status: He is alert and oriented to person, place, and time.     GCS: GCS eye subscore is 4. GCS verbal subscore is 5. GCS motor subscore is 6.     Cranial Nerves: No facial asymmetry.     Motor: No weakness or pronator drift.     Coordination: Coordination normal. Finger-Nose-Finger Test normal.     Gait: Gait normal.     BP (!) 152/90   Pulse 87   Ht 6\' 2"  (1.88 m)   Wt 247 lb 1.9 oz (112.1 kg)   SpO2 98%   BMI 31.73 kg/m  Wt Readings from Last 3  Encounters:  11/21/22 247 lb 1.9 oz (112.1 kg)  10/24/22 246 lb 1.3 oz (111.6 kg)  09/24/22 246 lb (111.6 kg)    Lab Results  Component Value Date   TSH 1.900 04/02/2022   Lab Results  Component Value Date   WBC 6.9 04/02/2022   HGB 14.3 04/02/2022   HCT 41.2 04/02/2022   MCV 98 (H) 04/02/2022   PLT 303 04/02/2022   Lab Results  Component Value Date   NA 144 04/02/2022   K 4.5 04/02/2022   CO2 22 04/02/2022   GLUCOSE 121 (H) 04/02/2022   BUN 13 04/02/2022   CREATININE 1.24 04/02/2022   BILITOT 0.5 04/02/2022   ALKPHOS 66 04/02/2022   AST 22 04/02/2022   ALT 23 04/02/2022   PROT 7.2 04/02/2022   ALBUMIN 4.3 04/02/2022   CALCIUM 9.3 04/02/2022   ANIONGAP 10 03/31/2020   EGFR 70 04/02/2022   Lab Results  Component Value Date   CHOL 195 04/02/2022   Lab Results  Component Value Date   HDL 35 (L) 04/02/2022   Lab Results  Component Value Date   LDLCALC 146 (H) 04/02/2022   Lab Results  Component Value Date   TRIG 74 04/02/2022   Lab Results  Component Value Date   CHOLHDL 5.6 (H) 04/02/2022   Lab Results  Component Value Date   HGBA1C 5.6 04/02/2022      Assessment & Plan:  Cervical pain (neck) Assessment & Plan: Onset of symptoms after leaving the gym, he notes performing bench press exercises He complains of a throbbing pain in his neck that radiates to his right shoulder with complaints of numbness and tingling in the right second finger only; he notes a vibrating sensation in his left lower extremity when lying supine Onset of symptoms 3 weeks ago He denies red flag symptoms such as neck pain with lower extremity weakness, gait or coronation difficulties, bowel bladder dysfunction, neck pain with fever and chills, neck pain with UNEXPLAINED weight loss, shoulder or hip girdle pain Will get imaging studies of the cervical spine  Encouraged to take baclofen 10 mg 3 times daily as needed for musculoskeletal pain     Orders: -     Baclofen; Take  1 tablet (10 mg total) by mouth 3 (three) times daily.  Dispense: 30 each; Refill: 0 -     MR CERVICAL SPINE WO CONTRAST  Essential hypertension Assessment & Plan: Controlled Patient is asymptomatic Encouraged low-sodium diet with increased physical activity No changes to treatment regimen today Encouraged to continue taking telmisartan  40 mg daily   Erectile dysfunction, unspecified erectile dysfunction type Assessment & Plan: Refilled Viagra 25 mg as needed  Orders: -     Sildenafil Citrate; Take 1 tablet (25 mg total) by mouth as needed for erectile dysfunction.  Dispense: 30 tablet; Refill: 0    Follow-up: Return in about 3 months (around 02/20/2023).   Alvira Monday, FNP

## 2022-11-21 NOTE — Patient Instructions (Addendum)
I appreciate the opportunity to provide care to you today!    Follow up:  3 month  Please report to the ED for red flag symptoms of neck pain -Lower extremity weakness -Gait or coronation difficulties Bowel bladder dysfunction Neck pain with fever and neck stiffness Neck pain with unexplained weight loss Shoulder, hip griddle pain  I have ordered an MRI of your cervical spine to assess for patient nerve in the neck  A prescription for baclofen  has been sent to your pharmacy  Please continue to a heart-healthy diet and increase your physical activities. Try to exercise for 65mins at least five times a week.      It was a pleasure to see you and I look forward to continuing to work together on your health and well-being. Please do not hesitate to call the office if you need care or have questions about your care.   Have a wonderful day and week. With Gratitude, Alvira Monday MSN, FNP-BC

## 2022-11-22 DIAGNOSIS — M542 Cervicalgia: Secondary | ICD-10-CM | POA: Insufficient documentation

## 2022-11-22 NOTE — Assessment & Plan Note (Signed)
Refilled Viagra 25 mg as needed

## 2022-11-22 NOTE — Assessment & Plan Note (Signed)
Onset of symptoms after leaving the gym, he notes performing bench press exercises He complains of a throbbing pain in his neck that radiates to his right shoulder with complaints of numbness and tingling in the right second finger only; he notes a vibrating sensation in his left lower extremity when lying supine Onset of symptoms 3 weeks ago He denies red flag symptoms such as neck pain with lower extremity weakness, gait or coronation difficulties, bowel bladder dysfunction, neck pain with fever and chills, neck pain with UNEXPLAINED weight loss, shoulder or hip girdle pain Will get imaging studies of the cervical spine  Encouraged to take baclofen 10 mg 3 times daily as needed for musculoskeletal pain

## 2022-11-22 NOTE — Assessment & Plan Note (Signed)
Controlled Patient is asymptomatic Encouraged low-sodium diet with increased physical activity No changes to treatment regimen today Encouraged to continue taking telmisartan 40 mg daily

## 2022-11-28 ENCOUNTER — Other Ambulatory Visit: Payer: Self-pay | Admitting: Family Medicine

## 2022-11-28 DIAGNOSIS — I10 Essential (primary) hypertension: Secondary | ICD-10-CM

## 2022-11-29 ENCOUNTER — Telehealth: Payer: Self-pay | Admitting: Family Medicine

## 2022-11-29 NOTE — Telephone Encounter (Signed)
Patient called said still a lot of pain in neck, what should patient do not getting any better. Call back # [phone].

## 2022-11-30 ENCOUNTER — Other Ambulatory Visit: Payer: Self-pay | Admitting: Family Medicine

## 2022-11-30 DIAGNOSIS — M542 Cervicalgia: Secondary | ICD-10-CM

## 2022-11-30 MED ORDER — TRAMADOL HCL 50 MG PO TABS: 25.0000 mg | ORAL_TABLET | Freq: Four times a day (QID) | ORAL | 0 refills | Status: AC | PRN

## 2022-11-30 NOTE — Telephone Encounter (Signed)
Pt informed

## 2022-11-30 NOTE — Telephone Encounter (Signed)
Has he gotten the MRI of the cervical spine? And is he taking the baclofen 10 mg three times daily

## 2022-11-30 NOTE — Telephone Encounter (Signed)
Kindly inform the patient that a prescription for tramadol 25 mg to be taken every 6 hours is sent to the pharmacy. Please caution the patients regarding the risk of sedation, mainly when used with muscle relaxants like baclofen.

## 2022-11-30 NOTE — Telephone Encounter (Signed)
He is scheduled on 12/12/2022, and has been taking medication as instructed.

## 2022-12-02 ENCOUNTER — Encounter (HOSPITAL_COMMUNITY): Payer: Self-pay

## 2022-12-02 ENCOUNTER — Other Ambulatory Visit: Payer: Self-pay

## 2022-12-02 ENCOUNTER — Emergency Department (HOSPITAL_COMMUNITY)
Admission: EM | Admit: 2022-12-02 | Discharge: 2022-12-02 | Disposition: A | Discharge status: Home / Self Care | Payer: 59 | Attending: Student | Admitting: Student

## 2022-12-02 DIAGNOSIS — M5412 Radiculopathy, cervical region: Secondary | ICD-10-CM | POA: Diagnosis not present

## 2022-12-02 DIAGNOSIS — M542 Cervicalgia: Secondary | ICD-10-CM | POA: Diagnosis present

## 2022-12-02 HISTORY — DX: Sleep apnea, unspecified: G47.30

## 2022-12-02 MED ORDER — DEXAMETHASONE SODIUM PHOSPHATE 10 MG/ML IJ SOLN
10.0000 mg | Freq: Once | INTRAMUSCULAR | Status: AC
  Administered 2022-12-02: 10 mg via INTRAMUSCULAR
  Filled 2022-12-02: qty 1

## 2022-12-02 MED ORDER — KETOROLAC TROMETHAMINE 15 MG/ML IJ SOLN
15.0000 mg | Freq: Once | INTRAMUSCULAR | Status: AC
  Administered 2022-12-02: 15 mg via INTRAMUSCULAR
  Filled 2022-12-02: qty 1

## 2022-12-02 MED ORDER — PREDNISONE 20 MG PO TABS: 40.0000 mg | ORAL_TABLET | Freq: Every day | ORAL | 0 refills | Status: AC

## 2022-12-02 MED ORDER — OXYCODONE-ACETAMINOPHEN 5-325 MG PO TABS: 1.0000 | ORAL_TABLET | Freq: Four times a day (QID) | ORAL | 0 refills | Status: DC | PRN

## 2022-12-02 NOTE — ED Triage Notes (Signed)
Pt with a diagnosis of cervical radiculopathy and has an MRI scheduled in the future but is having no relief with current pain meds, and muscle relaxers.

## 2022-12-02 NOTE — ED Provider Notes (Signed)
[facility] Provider Note   CSN: [id number] Arrival date & time: 12/02/22  1912     History  Chief Complaint  Patient presents with   Neck Pain    Jeffery Cabrera is a 57 y.o. male who presents to the ED with concerns for neck pain that has been ongoing.  He was evaluated by his primary care provider for this and given an order for an outpatient MRI of the cervical spine.  He was diagnosed with cervical radiculopathy.  He was prescribed gabapentin and baclofen for symptoms.  Patient notes that he is here today due to persistent pain.  Denies numbness, tingling, weakness of bilateral arms.  The history is provided by the patient. No language interpreter was used.       Home Medications Prior to Admission medications   Medication Sig Start Date End Date Taking? Authorizing Provider  oxyCODONE-acetaminophen (PERCOCET/ROXICET) 5-325 MG tablet Take 1 tablet by mouth every 6 (six) hours as needed for severe pain. 12/02/22  Yes Eduard Penkala A, PA-C  baclofen (LIORESAL) 10 MG tablet Take 1 tablet (10 mg total) by mouth 3 (three) times daily. 11/21/22   Alvira Monday, FNP  gabapentin (NEURONTIN) 100 MG capsule Take 100 mg by mouth daily. 11/21/22   [provider]  rosuvastatin (CRESTOR) 10 MG tablet Take 1 tablet (10 mg total) by mouth daily. 04/04/22   Alvira Monday, FNP  sildenafil (VIAGRA) 25 MG tablet Take 1 tablet (25 mg total) by mouth as needed for erectile dysfunction. 11/21/22   Alvira Monday, FNP  telmisartan (MICARDIS) 40 MG tablet Take 1 tablet (40 mg total) by mouth daily. 10/24/22   Alvira Monday, FNP  Vitamin D, Ergocalciferol, (DRISDOL) 1.25 MG (50000 UNIT) CAPS capsule TAKE 1 CAPSULE BY MOUTH EVERY 7 DAYS 06/11/22   Alvira Monday, FNP      Allergies    Patient has no known allergies.    Review of Systems   Review of Systems  Musculoskeletal:  Positive for neck pain.  All other systems reviewed and are  negative.   Physical Exam Updated Vital Signs BP (!) 149/94 (BP Location: Left Arm)   Pulse 99   Temp 98.4 F (36.9 C) (Oral)   Resp 16   Ht 6' 2"$  (1.88 m)   Wt 111.1 kg   SpO2 96%   BMI 31.46 kg/m  Physical Exam Vitals and nursing note reviewed.  Constitutional:      General: He is not in acute distress.    Appearance: Normal appearance.  Eyes:     General: No scleral icterus.    Extraocular Movements: Extraocular movements intact.  Neck:     Comments: No spinal tenderness to palpation.  Mild tenderness to palpation noted to lateral aspect of neck on the right side. Cardiovascular:     Rate and Rhythm: Normal rate.  Pulmonary:     Effort: Pulmonary effort is normal. No respiratory distress.  Abdominal:     Palpations: Abdomen is soft. There is no mass.     Tenderness: There is no abdominal tenderness.  Musculoskeletal:        General: Normal range of motion.     Cervical back: Neck supple.  Skin:    General: Skin is warm and dry.     Findings: No rash.  Neurological:     Mental Status: He is alert.     Sensory: Sensation is intact.     Motor: Motor function is intact.  Comments: Negative pronator drift. Grip strength 5/5 bilaterally. Strength and sensation intact to BUE.   Psychiatric:        Behavior: Behavior normal.     ED Results / Procedures / Treatments   Labs (all labs ordered are listed, but only abnormal results are displayed) Labs Reviewed - No data to display  EKG None  Radiology No results found.  Procedures Procedures    Medications Ordered in ED Medications  dexamethasone (DECADRON) injection 10 mg (10 mg Intramuscular Given 12/02/22 2106)  ketorolac (TORADOL) 15 MG/ML injection 15 mg (15 mg Intramuscular Given 12/02/22 2107)    ED Course/ Medical Decision Making/ A&P Clinical Course as of 12/10/22 1530  Sun Dec 02, 2022  2216 Re-evaluated and noted improvement of symptoms.  Discussed with patient discharge treatment plan.   Answered all available questions.  Patient appears safe for discharge at this time. [SB]    Clinical Course User Index [SB] Andreana Klingerman A, PA-C                             Medical Decision Making Risk Prescription drug management.   Pt presents with neck pain.  Was previously diagnosed with cervical radiculopathy.  Patient afebrile.  On exam patient with No spinal tenderness to palpation.  Mild tenderness to palpation noted to lateral aspect of neck on the right side.  Negative pronator drift.  Grip strength 5/5 bilaterally.  Strength and sensation intact to bilateral upper extremities.  No acute cardiovascular respiratory exam findings.  Differential diagnosis includes cervical radiculopathy, fracture, dislocation,***.   Additional history obtained:  External records from outside source obtained and reviewed including: Patient was evaluated by his primary care provider 11/21/2022 for similar symptoms.  At that time was provided with an outpatient order for cervical spine MRI.  Medications:  I ordered medication including Toradol, Decadron for symptom management Reevaluation of the patient after these medicines and interventions, I reevaluated the patient and found that they have improved I have reviewed the patients home medicines and have made adjustments as needed   Disposition: Presentation suspicious for exacerbation of cervical radiculopathy.  Doubt at this time concerns for fracture or dislocation. After consideration of the diagnostic results and the patients response to treatment, I feel that the patient would benefit from Discharge home.  PDMP reviewed.  Short course of Percocet sent to patient's pharmacy.  Patient sent with a prescription for prednisone.  Given information for neurosurgery for follow-up.  Supportive care measures and strict return precautions discussed with patient at bedside. Pt acknowledges and verbalizes understanding. Pt appears safe for discharge. Follow up  as indicated in discharge paperwork.    This chart was dictated using voice recognition software, Dragon. Despite the best efforts of this provider to proofread and correct errors, errors may still occur which can change documentation meaning.   Final Clinical Impression(s) / ED Diagnoses Final diagnoses:  Cervical radiculopathy    Rx / DC Orders ED Discharge Orders          Ordered    predniSONE (DELTASONE) 20 MG tablet  Daily        12/02/22 2218    oxyCODONE-acetaminophen (PERCOCET/ROXICET) 5-325 MG tablet  Every 6 hours PRN        12/02/22 2228              Layonna Dobie A, PA-C 12/10/22 1530    Kommor, [place], MD 12/10/22 1842

## 2022-12-02 NOTE — Discharge Instructions (Addendum)
It was a pleasure taking care of you today!  Attached is information for the neurosurgeon, call and set up a follow-up appointment regarding today's ED visit.  Follow-up with your primary care provider regarding today's ED visit.  You will be sent a short course of Percocet to take for breakthrough pain.  Do not take the tramadol at the same time that you are taking the percocet as it can cause increased drowsiness. Also be sent a prescription for prednisone, take as directed.  Return to the emergency department if you experience increasing/worsening symptoms.

## 2022-12-03 ENCOUNTER — Telehealth: Payer: Self-pay

## 2022-12-03 MED FILL — Oxycodone w/ Acetaminophen Tab 5-325 MG: ORAL | Qty: 6 | Status: AC

## 2022-12-03 NOTE — Telephone Encounter (Signed)
Transition Care Management Follow-up Telephone Call Date of discharge and from where: 12/02/22 [facility] How have you been since you were released from the hospital? better Any questions or concerns? No  Items Reviewed: Did the pt receive and understand the discharge instructions provided? Yes  Medications obtained and verified? Yes  Other? No  Any new allergies since your discharge? Yes  Dietary orders reviewed? Yes Do you have support at home? No   Functional Questionnaire: (I = Independent and D = Dependent) ADLs: I  Bathing/Dressing- I  Meal Prep- I  Eating- I  Maintaining continence- I  Transferring/Ambulation- I  Managing Meds- I  Follow up appointments reviewed:  PCP Hospital f/u appt confirmed? No  He wants to wait until he has MRI 12/12/22 @12 :[facility] f/u appt confirmed? No   Are transportation arrangements needed? No  If their condition worsens, is the pt aware to call PCP or go to the Emergency Dept.? Yes Was the patient provided with contact information for the PCP's office or ED? Yes Was to pt encouraged to call back with questions or concerns? Yes

## 2022-12-07 ENCOUNTER — Inpatient Hospital Stay: Payer: 59 | Admitting: Family Medicine

## 2022-12-12 ENCOUNTER — Ambulatory Visit (HOSPITAL_COMMUNITY)
Admission: RE | Admit: 2022-12-12 | Discharge: 2022-12-12 | Disposition: A | Discharge status: Home / Self Care | Payer: 59 | Source: Ambulatory Visit | Attending: Family Medicine | Admitting: Family Medicine

## 2022-12-12 ENCOUNTER — Telehealth: Payer: Self-pay | Admitting: Family Medicine

## 2022-12-12 ENCOUNTER — Other Ambulatory Visit: Payer: Self-pay | Admitting: Family Medicine

## 2022-12-12 DIAGNOSIS — M542 Cervicalgia: Secondary | ICD-10-CM | POA: Insufficient documentation

## 2022-12-12 DIAGNOSIS — M5412 Radiculopathy, cervical region: Secondary | ICD-10-CM

## 2022-12-12 MED ORDER — PREDNISONE 20 MG PO TABS: 40.0000 mg | ORAL_TABLET | Freq: Every day | ORAL | 0 refills | Status: AC

## 2022-12-12 NOTE — Telephone Encounter (Signed)
Spoke with patient.

## 2022-12-12 NOTE — Telephone Encounter (Signed)
Please inform the patient that a short supply of prednisone has been sent to the pharmacy.  I do not advise him to take tramadol and prednisone simultaneously.  Please encourage the patient to take baclofen instead.

## 2022-12-12 NOTE — Telephone Encounter (Signed)
Patietn called still in a lot of pain needs refill traMADol (ULTRAM) 50 MG tablet   Said the prednisone did help  Just had MRI and feels like hsi shoulder is going to fall off due to work he does.  Pharmacy: [facility]  Patient asked if nurse or provider will come him a call back

## 2022-12-17 ENCOUNTER — Ambulatory Visit (INDEPENDENT_AMBULATORY_CARE_PROVIDER_SITE_OTHER): Payer: 59 | Admitting: Family Medicine

## 2022-12-17 ENCOUNTER — Encounter: Payer: Self-pay | Admitting: Family Medicine

## 2022-12-17 DIAGNOSIS — M5412 Radiculopathy, cervical region: Secondary | ICD-10-CM

## 2022-12-17 MED ORDER — OXYCODONE-ACETAMINOPHEN 5-325 MG PO TABS: 1.0000 | ORAL_TABLET | Freq: Four times a day (QID) | ORAL | 0 refills | Status: DC | PRN

## 2022-12-17 NOTE — Progress Notes (Unsigned)
   Virtual Visit via Video Note  I connected with Jeffery Cabrera on 12/18/22 at  1:20 PM EST by a video enabled telemedicine application and verified that I am speaking with the correct person using two identifiers.  Patient Location: Home Provider Location: Office/Clinic  I discussed the limitations, risks, security, and privacy concerns of performing an evaluation and management service by video and the availability of in person appointments. I also discussed with the patient that there may be a patient responsible charge related to this service. The patient expressed understanding and agreed to proceed.  Subjective: PCP: Alvira Monday, FNP  Chief Complaint  Patient presents with   Neck Pain    Pt reports neck pain would like to have mri results.    HPI The patient woull like further explanation of his MRI results and complains of constant neck pain.  He reports relief of symptoms with prednisone taper.  He rates pain 8-9 out of 10.   ROS: Per HPI  Current Outpatient Medications:    baclofen (LIORESAL) 10 MG tablet, Take 1 tablet (10 mg total) by mouth 3 (three) times daily., Disp: 30 each, Rfl: 0   gabapentin (NEURONTIN) 100 MG capsule, Take 100 mg by mouth daily., Disp: , Rfl:    rosuvastatin (CRESTOR) 10 MG tablet, Take 1 tablet (10 mg total) by mouth daily., Disp: 90 tablet, Rfl: 3   sildenafil (VIAGRA) 25 MG tablet, Take 1 tablet (25 mg total) by mouth as needed for erectile dysfunction., Disp: 30 tablet, Rfl: 0   telmisartan (MICARDIS) 40 MG tablet, Take 1 tablet (40 mg total) by mouth daily., Disp: 30 tablet, Rfl: 1   Vitamin D, Ergocalciferol, (DRISDOL) 1.25 MG (50000 UNIT) CAPS capsule, TAKE 1 CAPSULE BY MOUTH EVERY 7 DAYS, Disp: 5 capsule, Rfl: 1   oxyCODONE-acetaminophen (PERCOCET/ROXICET) 5-325 MG tablet, Take 1 tablet by mouth every 6 (six) hours as needed for up to 15 days for severe pain., Disp: 6 tablet, Rfl: 0  Observations/Objective:  Physical Exam No  labored breathing.  Speech is clear and coherent with logical content.  Patient is alert and oriented at baseline.   Assessment and Plan: Cervical radiculopathy Inform the patient that his MRI was negative for cervical stenosis Will place an urgent referral to orthopedic surgery for further evaluation Patient was treated with steroid taper a week ago Will treat his pain today with Percocet 5-325 mg  Follow Up Instructions: No follow-ups on file.   I discussed the assessment and treatment plan with the patient. The patient was provided an opportunity to ask questions, and all were answered. The patient agreed with the plan and demonstrated an understanding of the instructions.   The patient was advised to call back or seek an in-person evaluation if the symptoms worsen or if the condition fails to improve as anticipated.  The above assessment and management plan was discussed with the patient. The patient verbalized understanding of and has agreed to the management plan.   Alvira Monday, FNP

## 2022-12-17 NOTE — Progress Notes (Signed)
Please inform the patient that his MRI was negative for cervical spinal stenosis or a Pinch nerve.  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 Please follow-up with orthopedics as scheduled

## 2022-12-19 ENCOUNTER — Telehealth: Payer: Self-pay | Admitting: Family Medicine

## 2022-12-19 ENCOUNTER — Other Ambulatory Visit: Payer: Self-pay | Admitting: Family Medicine

## 2022-12-19 DIAGNOSIS — M5412 Radiculopathy, cervical region: Secondary | ICD-10-CM

## 2022-12-19 MED ORDER — OXYCODONE-ACETAMINOPHEN 5-325 MG PO TABS: 1.0000 | ORAL_TABLET | Freq: Four times a day (QID) | ORAL | 0 refills | Status: DC | PRN

## 2022-12-20 ENCOUNTER — Other Ambulatory Visit: Payer: Self-pay | Admitting: Family Medicine

## 2022-12-20 DIAGNOSIS — M5412 Radiculopathy, cervical region: Secondary | ICD-10-CM

## 2022-12-20 MED ORDER — OXYCODONE-ACETAMINOPHEN 5-325 MG PO TABS: 1.0000 | ORAL_TABLET | Freq: Four times a day (QID) | ORAL | 0 refills | Status: AC | PRN

## 2022-12-24 NOTE — Telephone Encounter (Signed)
n

## 2022-12-27 ENCOUNTER — Other Ambulatory Visit: Payer: Self-pay | Admitting: Family Medicine

## 2022-12-27 DIAGNOSIS — I10 Essential (primary) hypertension: Secondary | ICD-10-CM

## 2022-12-31 ENCOUNTER — Other Ambulatory Visit: Payer: Self-pay | Admitting: Family Medicine

## 2022-12-31 DIAGNOSIS — N529 Male erectile dysfunction, unspecified: Secondary | ICD-10-CM

## 2023-01-08 ENCOUNTER — Telehealth: Payer: Self-pay | Admitting: Family Medicine

## 2023-01-08 NOTE — Telephone Encounter (Signed)
Pt reports not doing better, in severe pain on right shoulder, unable to do usual activities due to this.

## 2023-01-08 NOTE — Telephone Encounter (Signed)
Called pt gave him phone number to call and schedule.

## 2023-01-08 NOTE — Telephone Encounter (Signed)
Kindly follow up on the referral placed to orthopedics.

## 2023-01-08 NOTE — Telephone Encounter (Signed)
Pt asked for a call from nurse about nerve dx in arm?

## 2023-02-21 ENCOUNTER — Other Ambulatory Visit: Payer: Self-pay | Admitting: Family Medicine

## 2023-02-22 ENCOUNTER — Encounter: Payer: Self-pay | Admitting: Family Medicine

## 2023-02-22 ENCOUNTER — Ambulatory Visit (INDEPENDENT_AMBULATORY_CARE_PROVIDER_SITE_OTHER): Payer: 59 | Admitting: Family Medicine

## 2023-02-22 VITALS — BP 140/86 | HR 87 | Ht 74.0 in | Wt 252.1 lb

## 2023-02-22 DIAGNOSIS — E7849 Other hyperlipidemia: Secondary | ICD-10-CM

## 2023-02-22 DIAGNOSIS — R7301 Impaired fasting glucose: Secondary | ICD-10-CM

## 2023-02-22 DIAGNOSIS — I10 Essential (primary) hypertension: Secondary | ICD-10-CM

## 2023-02-22 DIAGNOSIS — G629 Polyneuropathy, unspecified: Secondary | ICD-10-CM | POA: Diagnosis not present

## 2023-02-22 DIAGNOSIS — E559 Vitamin D deficiency, unspecified: Secondary | ICD-10-CM

## 2023-02-22 DIAGNOSIS — M542 Cervicalgia: Secondary | ICD-10-CM | POA: Diagnosis not present

## 2023-02-22 DIAGNOSIS — G4733 Obstructive sleep apnea (adult) (pediatric): Secondary | ICD-10-CM | POA: Diagnosis not present

## 2023-02-22 DIAGNOSIS — E038 Other specified hypothyroidism: Secondary | ICD-10-CM

## 2023-02-22 DIAGNOSIS — Z1211 Encounter for screening for malignant neoplasm of colon: Secondary | ICD-10-CM

## 2023-02-22 DIAGNOSIS — N529 Male erectile dysfunction, unspecified: Secondary | ICD-10-CM

## 2023-02-22 MED ORDER — GABAPENTIN 100 MG PO CAPS
100.0000 mg | ORAL_CAPSULE | Freq: Every day | ORAL | 0 refills | Status: AC
Start: 2023-02-22 — End: ?

## 2023-02-22 MED ORDER — TELMISARTAN 40 MG PO TABS: 40.0000 mg | ORAL_TABLET | Freq: Every day | ORAL | 1 refills | Status: DC

## 2023-02-22 MED ORDER — SILDENAFIL CITRATE 25 MG PO TABS: 25.0000 mg | ORAL_TABLET | ORAL | 0 refills | Status: DC | PRN

## 2023-02-22 NOTE — Assessment & Plan Note (Signed)
Uncontrolled Reports compliance with telmisartan 40 mg daily Reports adherence with low-sodium diet Admits to minimal physical activity recently We will follow-up on BP in 2 weeks We will consider increasing his dose if his BP is not controlled at his next appointment Low-sodium diet with increased physical activity encouraged

## 2023-02-22 NOTE — Assessment & Plan Note (Signed)
Cervical pain has resolved Complains of a lingering numbness and tingling in his right arm No recent injury or trauma reported Sensation is intact Refill gabapentin 100 mg daily and encouraged to take Encouraged to take over-the-counter vitamin B12 1000 mcg daily Will continue to monitor

## 2023-02-22 NOTE — Progress Notes (Signed)
Established Patient Office Visit  Subjective:  Patient ID: Jeffery Cabrera, male    DOB: [birth year]  Age: 57 y.o. MRN: [id number]  CC:  Chief Complaint  Patient presents with   Chronic Care Management    3 month f/u, pt reports neck pain better, however his right arm feels tingly has been wearing a hand brace to help but not helping much.     HPI Jeffery Cabrera is a 57 y.o. male with past medical history of erectile dysfunction, hypertension, and sleep apnea presents for f/u of  chronic medical conditions.  Past Medical History:  Diagnosis Date   Pneumonia    Sleep apnea     History reviewed. No pertinent surgical history.  History reviewed. No pertinent family history.  Social History   Socioeconomic History   Marital status: Legally Separated    Spouse name: Not on file   Number of children: 2   Years of education: Not on file   Highest education level: Not on file  Occupational History   Occupation: customer service mgr.  Tobacco Use   Smoking status: Never   Smokeless tobacco: Never  Vaping Use   Vaping Use: Never used  Substance and Sexual Activity   Alcohol use: Not Currently   Drug use: No   Sexual activity: Yes  Other Topics Concern   Not on file  Social History Narrative   Not on file   Social Determinants of Health   Financial Resource Strain: Not on file  Food Insecurity: Not on file  Transportation Needs: Not on file  Physical Activity: Not on file  Stress: Not on file  Social Connections: Not on file  Intimate Partner Violence: Not on file    Outpatient Medications Prior to Visit  Medication Sig Dispense Refill   baclofen (LIORESAL) 10 MG tablet Take 1 tablet (10 mg total) by mouth 3 (three) times daily. 30 each 0   oxyCODONE-acetaminophen (PERCOCET/ROXICET) 5-325 MG tablet Take 1 tablet by mouth every 6 (six) hours as needed for severe pain. 15 tablet 0   rosuvastatin (CRESTOR) 10 MG tablet Take 1 tablet (10 mg total) by mouth  daily. 90 tablet 3   Vitamin D, Ergocalciferol, (DRISDOL) 1.25 MG (50000 UNIT) CAPS capsule TAKE 1 CAPSULE BY MOUTH EVERY 7 DAYS 5 capsule 1   gabapentin (NEURONTIN) 100 MG capsule Take 100 mg by mouth daily.     sildenafil (VIAGRA) 25 MG tablet TAKE 1 TABLET BY MOUTH DAILY AS NEEDED FOR ERECTILE DYSFUNCTION 30 tablet 0   telmisartan (MICARDIS) 40 MG tablet TAKE 1 TABLET(40 MG) BY MOUTH DAILY 30 tablet 1   No facility-administered medications prior to visit.    No Known Allergies  ROS Review of Systems  Constitutional:  Negative for fatigue and fever.  Eyes:  Negative for visual disturbance.  Respiratory:  Negative for chest tightness and shortness of breath.   Cardiovascular:  Negative for chest pain and palpitations.  Neurological:  Negative for dizziness and headaches.      Objective:    Physical Exam HENT:     Head: Normocephalic.     Right Ear: External ear normal.     Left Ear: External ear normal.     Nose: No congestion or rhinorrhea.     Mouth/Throat:     Mouth: Mucous membranes are moist.  Cardiovascular:     Rate and Rhythm: Regular rhythm.     Heart sounds: No murmur heard. Pulmonary:     Effort: No respiratory  distress.     Breath sounds: Normal breath sounds.  Musculoskeletal:     Right forearm: No swelling, edema, deformity, lacerations or tenderness.     Right hand: No swelling, deformity or tenderness. Normal range of motion.  Neurological:     Mental Status: He is alert.     BP (!) 140/86 (BP Location: Left Arm)   Pulse 87   Ht 6\' 2"  (1.88 m)   Wt 252 lb 1.3 oz (114.3 kg)   SpO2 97%   BMI 32.37 kg/m  Wt Readings from Last 3 Encounters:  02/22/23 252 lb 1.3 oz (114.3 kg)  12/02/22 245 lb (111.1 kg)  11/21/22 247 lb 1.9 oz (112.1 kg)    Lab Results  Component Value Date   TSH 1.900 04/02/2022   Lab Results  Component Value Date   WBC 6.9 04/02/2022   HGB 14.3 04/02/2022   HCT 41.2 04/02/2022   MCV 98 (H) 04/02/2022   PLT 303  04/02/2022   Lab Results  Component Value Date   NA 144 04/02/2022   K 4.5 04/02/2022   CO2 22 04/02/2022   GLUCOSE 121 (H) 04/02/2022   BUN 13 04/02/2022   CREATININE 1.24 04/02/2022   BILITOT 0.5 04/02/2022   ALKPHOS 66 04/02/2022   AST 22 04/02/2022   ALT 23 04/02/2022   PROT 7.2 04/02/2022   ALBUMIN 4.3 04/02/2022   CALCIUM 9.3 04/02/2022   ANIONGAP 10 03/31/2020   EGFR 70 04/02/2022   Lab Results  Component Value Date   CHOL 195 04/02/2022   Lab Results  Component Value Date   HDL 35 (L) 04/02/2022   Lab Results  Component Value Date   LDLCALC 146 (H) 04/02/2022   Lab Results  Component Value Date   TRIG 74 04/02/2022   Lab Results  Component Value Date   CHOLHDL 5.6 (H) 04/02/2022   Lab Results  Component Value Date   HGBA1C 5.6 04/02/2022      Assessment & Plan:  Essential hypertension Assessment & Plan: Uncontrolled Reports compliance with telmisartan 40 mg daily Reports adherence with low-sodium diet Admits to minimal physical activity recently We will follow-up on BP in 2 weeks We will consider increasing his dose if his BP is not controlled at his next appointment Low-sodium diet with increased physical activity encouraged  Orders: -     Telmisartan; Take 1 tablet (40 mg total) by mouth daily.  Dispense: 30 tablet; Refill: 1  Cervical pain (neck) Assessment & Plan: Cervical pain has resolved Complains of a lingering numbness and tingling in his right arm No recent injury or trauma reported Sensation is intact Refill gabapentin 100 mg daily and encouraged to take Encouraged to take over-the-counter vitamin B12 1000 mcg daily Will continue to monitor   Neuropathy Assessment & Plan: Numbness and tingling the right arm likely of neuropathy Refill gabapentin 100 mg daily  Orders: -     Gabapentin; Take 1 capsule (100 mg total) by mouth daily.  Dispense: 60 capsule; Refill: 0  Obstructive sleep apnea syndrome Assessment &  Plan: Reports sleeping nightly with his CPAP machine but yet complains of daytime somnolence Report getting adequate rest at nighttime The patient may need adjustments made on his CPAP pressure Encouraged to contact the manufacturer     Erectile dysfunction, unspecified erectile dysfunction type Assessment & Plan: Needs refills today of his Viagra 25 mg Refill sent to the pharmacy  Orders: -     Sildenafil Citrate; Take 1 tablet (25 mg  total) by mouth as needed for erectile dysfunction.  Dispense: 30 tablet; Refill: 0  Colon cancer screening -     Ambulatory referral to Gastroenterology  IFG (impaired fasting glucose) -     Hemoglobin A1c  Vitamin D deficiency -     VITAMIN D 25 Hydroxy (Vit-D Deficiency, Fractures)  Other specified hypothyroidism -     TSH + free T4  Other hyperlipidemia -     Lipid panel -     CMP14+EGFR -     CBC with Differential/Platelet   Note: This chart has been completed using Engineer, civil (consulting) software, and while attempts have been made to ensure accuracy, certain words and phrases may not be transcribed as intended.    Follow-up: Return in about 2 weeks (around 03/08/2023) for BP.   Gilmore Laroche, FNP

## 2023-02-22 NOTE — Patient Instructions (Addendum)
Kristen (chloride care you verify your name and birthday for me please Rx is calling to set up with dementia is in the clinic on 1 June I appreciate the opportunity to provide care to you today!    Follow up:  2 weeks for BP  Labs: please stop by the lab during the week to get your blood drawn (CBC, CMP, TSH, Lipid profile, HgA1c, Vit D)  I recommend taking over-the-counter vitamin B12 at 1000 mcg daily I recommend taking gabapentin 100 mg nightly to help with the numbness and tingling in your right arm  I recommend low-sodium diet, less than 1500 mg daily with increased physical activity for your blood pressure  Please pick up your refills at the pharmacy    Please continue to a heart-healthy diet and increase your physical activities. Try to exercise for at least five days a week.      It was a pleasure to see you and I look forward to continuing to work together on your health and well-being. Please do not hesitate to call the office if you need care or have questions about your care.   Have a wonderful day and week. With Gratitude, Gilmore Laroche MSN, FNP-BC

## 2023-02-22 NOTE — Assessment & Plan Note (Signed)
Reports sleeping nightly with his CPAP machine but yet complains of daytime somnolence Report getting adequate rest at nighttime The patient may need adjustments made on his CPAP pressure Encouraged to contact the manufacturer

## 2023-02-22 NOTE — Assessment & Plan Note (Signed)
Needs refills today of his Viagra 25 mg Refill sent to the pharmacy

## 2023-02-22 NOTE — Assessment & Plan Note (Signed)
Numbness and tingling the right arm likely of neuropathy Refill gabapentin 100 mg daily

## 2023-02-26 ENCOUNTER — Encounter (INDEPENDENT_AMBULATORY_CARE_PROVIDER_SITE_OTHER): Payer: Self-pay | Admitting: *Deleted

## 2023-03-12 ENCOUNTER — Ambulatory Visit (INDEPENDENT_AMBULATORY_CARE_PROVIDER_SITE_OTHER): Payer: 59 | Admitting: Family Medicine

## 2023-03-12 ENCOUNTER — Encounter: Payer: Self-pay | Admitting: Family Medicine

## 2023-03-12 VITALS — BP 136/86 | HR 91 | Ht 74.0 in | Wt 252.0 lb

## 2023-03-12 DIAGNOSIS — N529 Male erectile dysfunction, unspecified: Secondary | ICD-10-CM

## 2023-03-12 DIAGNOSIS — I10 Essential (primary) hypertension: Secondary | ICD-10-CM | POA: Diagnosis not present

## 2023-03-12 MED ORDER — SILDENAFIL CITRATE 25 MG PO TABS
25.0000 mg | ORAL_TABLET | ORAL | 0 refills | Status: DC | PRN
Start: 2023-03-12 — End: 2023-06-24

## 2023-03-12 NOTE — Patient Instructions (Addendum)
I appreciate the opportunity to provide care to you today!    Follow up:  3 months  Labs: next visit  I recommend a low sodium diet with increased physical activity    Please continue to a heart-healthy diet and increase your physical activities. Try to exercise for at least five days a week.      It was a pleasure to see you and I look forward to continuing to work together on your health and well-being. Please do not hesitate to call the office if you need care or have questions about your care.   Have a wonderful day and week. With Gratitude, Gilmore Laroche MSN, FNP-BC

## 2023-03-12 NOTE — Assessment & Plan Note (Signed)
Controlled Asymptomatic today in clinic Reports compliance with telmisartan 40 mg daily Reports adherence with low-sodium diet and increase physical activity Encouraged to continue treatment regimen BP Readings from Last 3 Encounters:  03/12/23 136/86  02/22/23 (!) 140/86  12/02/22 (!) 140/86

## 2023-03-12 NOTE — Progress Notes (Signed)
Established Patient Office Visit  Subjective:  Patient ID: Jeffery Cabrera, male    DOB: [birth year]  Age: 57 y.o. MRN: [id number]  CC:  Chief Complaint  Patient presents with   Hypertension    2 week f/u for htn check.     HPI Jeffery Cabrera is a 57 y.o. presents for for blood pressure follow-up.  For the details of today's visit, please refer to the assessment and plan.    Past Medical History:  Diagnosis Date   Pneumonia    Sleep apnea     History reviewed. No pertinent surgical history.  History reviewed. No pertinent family history.  Social History   Socioeconomic History   Marital status: Legally Separated    Spouse name: Not on file   Number of children: 2   Years of education: Not on file   Highest education level: Not on file  Occupational History   Occupation: customer service mgr.  Tobacco Use   Smoking status: Never   Smokeless tobacco: Never  Vaping Use   Vaping Use: Never used  Substance and Sexual Activity   Alcohol use: Not Currently   Drug use: No   Sexual activity: Yes  Other Topics Concern   Not on file  Social History Narrative   Not on file   Social Determinants of Health   Financial Resource Strain: Not on file  Food Insecurity: Not on file  Transportation Needs: Not on file  Physical Activity: Not on file  Stress: Not on file  Social Connections: Not on file  Intimate Partner Violence: Not on file    Outpatient Medications Prior to Visit  Medication Sig Dispense Refill   baclofen (LIORESAL) 10 MG tablet Take 1 tablet (10 mg total) by mouth 3 (three) times daily. 30 each 0   gabapentin (NEURONTIN) 100 MG capsule Take 1 capsule (100 mg total) by mouth daily. 60 capsule 0   oxyCODONE-acetaminophen (PERCOCET/ROXICET) 5-325 MG tablet Take 1 tablet by mouth every 6 (six) hours as needed for severe pain. 15 tablet 0   rosuvastatin (CRESTOR) 10 MG tablet Take 1 tablet (10 mg total) by mouth daily. 90 tablet 3   telmisartan  (MICARDIS) 40 MG tablet Take 1 tablet (40 mg total) by mouth daily. 30 tablet 1   Vitamin D, Ergocalciferol, (DRISDOL) 1.25 MG (50000 UNIT) CAPS capsule TAKE 1 CAPSULE BY MOUTH EVERY 7 DAYS 5 capsule 1   sildenafil (VIAGRA) 25 MG tablet Take 1 tablet (25 mg total) by mouth as needed for erectile dysfunction. 30 tablet 0   No facility-administered medications prior to visit.    No Known Allergies  ROS Review of Systems  Constitutional:  Negative for fatigue and fever.  Eyes:  Negative for visual disturbance.  Respiratory:  Negative for chest tightness and shortness of breath.   Cardiovascular:  Negative for chest pain and palpitations.  Neurological:  Negative for dizziness and headaches.      Objective:    Physical Exam HENT:     Head: Normocephalic.     Right Ear: External ear normal.     Left Ear: External ear normal.     Nose: No congestion or rhinorrhea.     Mouth/Throat:     Mouth: Mucous membranes are moist.  Cardiovascular:     Rate and Rhythm: Regular rhythm.     Heart sounds: No murmur heard. Pulmonary:     Effort: No respiratory distress.     Breath sounds: Normal breath sounds.  Neurological:  Mental Status: He is alert.     BP 136/86   Pulse 91   Ht 6\' 2"  (1.88 m)   Wt 252 lb 0.6 oz (114.3 kg)   SpO2 99%   BMI 32.36 kg/m  Wt Readings from Last 3 Encounters:  03/12/23 252 lb 0.6 oz (114.3 kg)  02/22/23 252 lb 1.3 oz (114.3 kg)  12/02/22 245 lb (111.1 kg)    Lab Results  Component Value Date   TSH 1.900 04/02/2022   Lab Results  Component Value Date   WBC 6.9 04/02/2022   HGB 14.3 04/02/2022   HCT 41.2 04/02/2022   MCV 98 (H) 04/02/2022   PLT 303 04/02/2022   Lab Results  Component Value Date   NA 144 04/02/2022   K 4.5 04/02/2022   CO2 22 04/02/2022   GLUCOSE 121 (H) 04/02/2022   BUN 13 04/02/2022   CREATININE 1.24 04/02/2022   BILITOT 0.5 04/02/2022   ALKPHOS 66 04/02/2022   AST 22 04/02/2022   ALT 23 04/02/2022   PROT 7.2  04/02/2022   ALBUMIN 4.3 04/02/2022   CALCIUM 9.3 04/02/2022   ANIONGAP 10 03/31/2020   EGFR 70 04/02/2022   Lab Results  Component Value Date   CHOL 195 04/02/2022   Lab Results  Component Value Date   HDL 35 (L) 04/02/2022   Lab Results  Component Value Date   LDLCALC 146 (H) 04/02/2022   Lab Results  Component Value Date   TRIG 74 04/02/2022   Lab Results  Component Value Date   CHOLHDL 5.6 (H) 04/02/2022   Lab Results  Component Value Date   HGBA1C 5.6 04/02/2022      Assessment & Plan:  Essential hypertension Assessment & Plan: Controlled Asymptomatic today in clinic Reports compliance with telmisartan 40 mg daily Reports adherence with low-sodium diet and increase physical activity Encouraged to continue treatment regimen BP Readings from Last 3 Encounters:  03/12/23 136/86  02/22/23 (!) 140/86  12/02/22 (!) 140/86      Erectile dysfunction, unspecified erectile dysfunction type Assessment & Plan: Refills sent to the pharmacy  Orders: -     Sildenafil Citrate; Take 1 tablet (25 mg total) by mouth as needed for erectile dysfunction.  Dispense: 30 tablet; Refill: 0    Follow-up: Return in about 3 months (around 06/12/2023).   Jeffery Laroche, FNP

## 2023-03-12 NOTE — Assessment & Plan Note (Signed)
Refills sent to the pharmacy.

## 2023-05-12 ENCOUNTER — Other Ambulatory Visit: Payer: Self-pay | Admitting: Family Medicine

## 2023-05-12 DIAGNOSIS — I10 Essential (primary) hypertension: Secondary | ICD-10-CM

## 2023-06-11 ENCOUNTER — Ambulatory Visit: Payer: 59 | Admitting: Family Medicine

## 2023-06-21 ENCOUNTER — Telehealth (INDEPENDENT_AMBULATORY_CARE_PROVIDER_SITE_OTHER): Payer: Self-pay | Admitting: *Deleted

## 2023-06-21 NOTE — Telephone Encounter (Signed)
Referring MD/PCP: Gilmore Laroche  Insurance: [facility] [id number]  Best Phone Number: [phone]  Reason for the colonoscopy screening  Has patient had this procedure before?  no  If so, when, by whom and where?    Is there a family history of colon cancer?  no  Who?  What age when diagnosed?    Is patient diabetic? If yes, Type 1 or Type 2   no      Does patient have prosthetic heart valve or mechanical valve?  no  Do you have a pacemaker/defibrillator?  no  Has patient ever had endocarditis/atrial fibrillation? no  Has patient had joint replacement within last 12 months?  no  Is patient constipated or do they take laxatives? no  Does patient have a history of alcohol/drug use?  no  Does patient use oxygen? yes  Have you had a stroke/heart attack last 6 mths? no  Do you take medicine for weight loss?  no  For male patients,: have you had a hysterectomy                       are you post menopausal                       do you still have your menstrual cycle   Do you take any blood-thinning medications such as: (aspirin, warfarin, Plavix, Aggrenox)  no  If yes we need the name, milligram, dosage and who is prescribing doctor   Medications: telmisartan 40 mg daily, gabapentin 100 mg daily, baclofen 10 mg 3 tabs daily, Vit D 50,000 units daily, rosuvstatin 10 mg daily  Allergies: NKDA  Pharmacy: [facility]

## 2023-06-21 NOTE — Telephone Encounter (Signed)
Room 1 Thanks 

## 2023-06-24 ENCOUNTER — Telehealth: Payer: Self-pay | Admitting: Family Medicine

## 2023-06-24 ENCOUNTER — Encounter: Payer: Self-pay | Admitting: Family Medicine

## 2023-06-24 ENCOUNTER — Ambulatory Visit (INDEPENDENT_AMBULATORY_CARE_PROVIDER_SITE_OTHER): Payer: 59 | Admitting: Family Medicine

## 2023-06-24 VITALS — BP 130/72 | HR 86 | Resp 16 | Ht 74.0 in | Wt 239.0 lb

## 2023-06-24 DIAGNOSIS — N529 Male erectile dysfunction, unspecified: Secondary | ICD-10-CM

## 2023-06-24 DIAGNOSIS — H539 Unspecified visual disturbance: Secondary | ICD-10-CM | POA: Diagnosis not present

## 2023-06-24 DIAGNOSIS — E7849 Other hyperlipidemia: Secondary | ICD-10-CM

## 2023-06-24 DIAGNOSIS — I10 Essential (primary) hypertension: Secondary | ICD-10-CM

## 2023-06-24 DIAGNOSIS — R7301 Impaired fasting glucose: Secondary | ICD-10-CM | POA: Diagnosis not present

## 2023-06-24 DIAGNOSIS — E559 Vitamin D deficiency, unspecified: Secondary | ICD-10-CM

## 2023-06-24 DIAGNOSIS — E038 Other specified hypothyroidism: Secondary | ICD-10-CM

## 2023-06-24 MED ORDER — TELMISARTAN 40 MG PO TABS
40.0000 mg | ORAL_TABLET | Freq: Every day | ORAL | 1 refills | Status: AC
Start: 2023-06-24 — End: ?

## 2023-06-24 MED ORDER — SILDENAFIL CITRATE 25 MG PO TABS
25.0000 mg | ORAL_TABLET | ORAL | 0 refills | Status: DC | PRN
Start: 2023-06-24 — End: 2023-10-17

## 2023-06-24 NOTE — Patient Instructions (Addendum)
I appreciate the opportunity to provide care to you today!    Follow up:  4 months  Labs: please stop by the lab during the week to get your blood drawn (CBC, CMP, TSH, Lipid profile, HgA1c, Vit D)  Please seek urgent care at the emergency department if you experience any of the following symptoms: sudden vision loss or a significant decrease in vision, severe eye pain, redness accompanied by pain, light sensitivity, the presence of spots or flashes in vision, double vision, or discharge from the eyes accompanied by severe pain, redness, or visual changes   Referrals today- [facility]    Please continue to a heart-healthy diet and increase your physical activities. Try to exercise for at least five days a week.    It was a pleasure to see you and I look forward to continuing to work together on your health and well-being. Please do not hesitate to call the office if you need care or have questions about your care.  In case of emergency, please visit the Emergency Department for urgent care, or contact our clinic at [phone] to schedule an appointment. We're here to help you!   Have a wonderful day and week. With Gratitude, Gilmore Laroche MSN, FNP-BC

## 2023-06-24 NOTE — Telephone Encounter (Signed)
Left message to return call 

## 2023-06-24 NOTE — Telephone Encounter (Signed)
Jeffery Cabrera called from [facility] received a referral asking did patient have a routine eye exam in the past.  Call # [phone]

## 2023-06-24 NOTE — Telephone Encounter (Signed)
Patient came by the office - stated he had an appointment downstairs - stated someone had called him to schedule a colonoscopy - was told someone would be giving him a call

## 2023-06-24 NOTE — Assessment & Plan Note (Signed)
The patient reports visual disturbances in the right eye, characterized by intermittent fogginess and blurriness, along with difficulty seeing from that eye. He also describes a sensation of something in the eye. On physical examination, there was no foreign body, redness, or irritation observed. The patient has previously followed up at [facility], where his eye exam was deemed unremarkable. He denies experiencing pain, redness, loss of vision, or floaters.  The patient is encouraged to seek urgent care at the emergency department if he experiences any of the following symptoms: sudden vision loss or a significant decrease in vision, severe eye pain, redness accompanied by pain, light sensitivity, spots or flashes in vision, double vision, or discharge from the eyes with severe pain, redness, or visual changes. A referral has been placed to [facility] for a second opinion regarding his eye complaint.

## 2023-06-24 NOTE — Progress Notes (Signed)
Established Patient Office Visit  Subjective:  Patient ID: Jeffery Cabrera, male    DOB: Aug 26, 1966  Age: 57 y.o. MRN: [id number]  CC:  Chief Complaint  Patient presents with   Hypertension    Follow up on weight and bp    HPI Jeffery Cabrera is a 57 y.o. male with past medical history of essential hypertension presents for f/u of  chronic medical conditions. For the details of today's visit, please refer to the assessment and plan.    Past Medical History:  Diagnosis Date   Pneumonia    Sleep apnea     No past surgical history on file.  No family history on file.  Social History   Socioeconomic History   Marital status: Legally Separated    Spouse name: Not on file   Number of children: 2   Years of education: Not on file   Highest education level: Not on file  Occupational History   Occupation: customer service mgr.  Tobacco Use   Smoking status: Never   Smokeless tobacco: Never  Vaping Use   Vaping status: Never Used  Substance and Sexual Activity   Alcohol use: Not Currently   Drug use: No   Sexual activity: Yes  Other Topics Concern   Not on file  Social History Narrative   Not on file   Social Determinants of Health   Financial Resource Strain: Not on file  Food Insecurity: Not on file  Transportation Needs: Not on file  Physical Activity: Not on file  Stress: Not on file  Social Connections: Not on file  Intimate Partner Violence: Not on file    Outpatient Medications Prior to Visit  Medication Sig Dispense Refill   gabapentin (NEURONTIN) 100 MG capsule Take 1 capsule (100 mg total) by mouth daily. 60 capsule 0   oxyCODONE-acetaminophen (PERCOCET/ROXICET) 5-325 MG tablet Take 1 tablet by mouth every 6 (six) hours as needed for severe pain. 15 tablet 0   rosuvastatin (CRESTOR) 10 MG tablet Take 1 tablet (10 mg total) by mouth daily. 90 tablet 3   Vitamin D, Ergocalciferol, (DRISDOL) 1.25 MG (50000 UNIT) CAPS capsule TAKE 1 CAPSULE BY  MOUTH EVERY 7 DAYS 5 capsule 1   sildenafil (VIAGRA) 25 MG tablet Take 1 tablet (25 mg total) by mouth as needed for erectile dysfunction. 30 tablet 0   telmisartan (MICARDIS) 40 MG tablet TAKE 1 TABLET(40 MG) BY MOUTH DAILY 30 tablet 1   baclofen (LIORESAL) 10 MG tablet Take 1 tablet (10 mg total) by mouth 3 (three) times daily. (Patient not taking: Reported on 06/24/2023) 30 each 0   No facility-administered medications prior to visit.    No Known Allergies  ROS Review of Systems  Constitutional:  Negative for fatigue and fever.  Eyes:  Positive for visual disturbance.  Respiratory:  Negative for chest tightness and shortness of breath.   Cardiovascular:  Negative for chest pain and palpitations.  Neurological:  Negative for dizziness and headaches.      Objective:    Physical Exam HENT:     Head: Normocephalic.     Right Ear: External ear normal.     Left Ear: External ear normal.     Nose: No congestion or rhinorrhea.     Mouth/Throat:     Mouth: Mucous membranes are moist.  Eyes:     General: Lids are normal.        Right eye: No foreign body, discharge or hordeolum.  Extraocular Movements:     Right eye: Normal extraocular motion and no nystagmus.     Left eye: Normal extraocular motion and no nystagmus.     Conjunctiva/sclera:     Right eye: Right conjunctiva is not injected.     Left eye: Left conjunctiva is not injected.  Cardiovascular:     Rate and Rhythm: Regular rhythm.     Heart sounds: No murmur heard. Pulmonary:     Effort: No respiratory distress.     Breath sounds: Normal breath sounds.  Neurological:     Mental Status: He is alert.     BP 130/72   Pulse 86   Resp 16   Ht 6\' 2"  (1.88 m)   Wt 239 lb (108.4 kg)   SpO2 96%   BMI 30.69 kg/m  Wt Readings from Last 3 Encounters:  06/24/23 239 lb (108.4 kg)  03/12/23 252 lb 0.6 oz (114.3 kg)  02/22/23 252 lb 1.3 oz (114.3 kg)    Lab Results  Component Value Date   TSH 1.900 04/02/2022    Lab Results  Component Value Date   WBC 6.9 04/02/2022   HGB 14.3 04/02/2022   HCT 41.2 04/02/2022   MCV 98 (H) 04/02/2022   PLT 303 04/02/2022   Lab Results  Component Value Date   NA 144 04/02/2022   K 4.5 04/02/2022   CO2 22 04/02/2022   GLUCOSE 121 (H) 04/02/2022   BUN 13 04/02/2022   CREATININE 1.24 04/02/2022   BILITOT 0.5 04/02/2022   ALKPHOS 66 04/02/2022   AST 22 04/02/2022   ALT 23 04/02/2022   PROT 7.2 04/02/2022   ALBUMIN 4.3 04/02/2022   CALCIUM 9.3 04/02/2022   ANIONGAP 10 03/31/2020   EGFR 70 04/02/2022   Lab Results  Component Value Date   CHOL 195 04/02/2022   Lab Results  Component Value Date   HDL 35 (L) 04/02/2022   Lab Results  Component Value Date   LDLCALC 146 (H) 04/02/2022   Lab Results  Component Value Date   TRIG 74 04/02/2022   Lab Results  Component Value Date   CHOLHDL 5.6 (H) 04/02/2022   Lab Results  Component Value Date   HGBA1C 5.6 04/02/2022      Assessment & Plan:  Essential hypertension Assessment & Plan: The patient's condition is currently controlled and asymptomatic. He is taking Telmisartan 40 mg daily and has lost 14 pounds since May 2024. The patient reports working out four days a week and adhering to a low-sodium diet. He is congratulated on his achievements and encouraged to continue with the low-sodium diet and increased physical activity.  BP Readings from Last 3 Encounters:  06/24/23 130/72  03/12/23 136/86  02/22/23 (!) 140/86      Orders: -     Telmisartan; Take 1 tablet (40 mg total) by mouth daily.  Dispense: 90 tablet; Refill: 1  Visual disturbance Assessment & Plan: The patient reports visual disturbances in the right eye, characterized by intermittent fogginess and blurriness, along with difficulty seeing from that eye. He also describes a sensation of something in the eye. On physical examination, there was no foreign body, redness, or irritation observed. The patient has previously  followed up at [facility], where his eye exam was deemed unremarkable. He denies experiencing pain, redness, loss of vision, or floaters.  The patient is encouraged to seek urgent care at the emergency department if he experiences any of the following symptoms: sudden vision loss or a significant decrease  in vision, severe eye pain, redness accompanied by pain, light sensitivity, spots or flashes in vision, double vision, or discharge from the eyes with severe pain, redness, or visual changes. A referral has been placed to [facility] for a second opinion regarding his eye complaint.  Orders: -     Ambulatory referral to Ophthalmology  Erectile dysfunction, unspecified erectile dysfunction type -     Sildenafil Citrate; Take 1 tablet (25 mg total) by mouth as needed for erectile dysfunction.  Dispense: 30 tablet; Refill: 0  IFG (impaired fasting glucose) -     Hemoglobin A1c  Vitamin D deficiency -     VITAMIN D 25 Hydroxy (Vit-D Deficiency, Fractures)  Other specified hypothyroidism -     TSH + free T4  Other hyperlipidemia -     Lipid panel -     CMP14+EGFR -     CBC with Differential/Platelet  Note: This chart has been completed using Engineer, civil (consulting) software, and while attempts have been made to ensure accuracy, certain words and phrases may not be transcribed as intended.    Follow-up: Return in about 4 months (around 10/24/2023).   Gilmore Laroche, FNP

## 2023-06-24 NOTE — Assessment & Plan Note (Addendum)
The patient's condition is currently controlled and asymptomatic. He is taking Telmisartan 40 mg daily and has lost 14 pounds since May 2024. The patient reports working out four days a week and adhering to a low-sodium diet. He is congratulated on his achievements and encouraged to continue with the low-sodium diet and increased physical activity.  BP Readings from Last 3 Encounters:  06/24/23 130/72  03/12/23 136/86  02/22/23 (!) 140/86

## 2023-06-25 MED ORDER — PEG 3350-KCL-NA BICARB-NACL 420 G PO SOLR: 4000.0000 mL | Freq: Once | ORAL | 0 refills | Status: AC

## 2023-06-25 NOTE — Telephone Encounter (Signed)
Pt left message returning call.  Returned call to patient and TCS scheduled for 07/04/23 at 7:30am with Dr.Castaneda. Prep sent to pharmacy. Instructions sent via my chart. No PA needed per [facility]

## 2023-07-04 ENCOUNTER — Ambulatory Visit (HOSPITAL_COMMUNITY)
Admission: RE | Admit: 2023-07-04 | Discharge: 2023-07-04 | Disposition: A | Discharge status: Home / Self Care | Payer: 59 | Attending: Gastroenterology | Admitting: Gastroenterology

## 2023-07-04 ENCOUNTER — Encounter (HOSPITAL_COMMUNITY): Payer: Self-pay | Admitting: Gastroenterology

## 2023-07-04 ENCOUNTER — Ambulatory Visit (HOSPITAL_BASED_OUTPATIENT_CLINIC_OR_DEPARTMENT_OTHER): Payer: 59 | Admitting: Anesthesiology

## 2023-07-04 ENCOUNTER — Encounter (HOSPITAL_COMMUNITY)
Admission: RE | Disposition: A | Discharge status: Home / Self Care | Payer: Self-pay | Source: Home / Self Care | Attending: Gastroenterology

## 2023-07-04 ENCOUNTER — Other Ambulatory Visit: Payer: Self-pay

## 2023-07-04 ENCOUNTER — Ambulatory Visit (HOSPITAL_COMMUNITY): Payer: 59 | Admitting: Anesthesiology

## 2023-07-04 DIAGNOSIS — G4730 Sleep apnea, unspecified: Secondary | ICD-10-CM | POA: Insufficient documentation

## 2023-07-04 DIAGNOSIS — K635 Polyp of colon: Secondary | ICD-10-CM | POA: Diagnosis not present

## 2023-07-04 DIAGNOSIS — Z1211 Encounter for screening for malignant neoplasm of colon: Secondary | ICD-10-CM | POA: Diagnosis present

## 2023-07-04 DIAGNOSIS — K648 Other hemorrhoids: Secondary | ICD-10-CM | POA: Diagnosis not present

## 2023-07-04 DIAGNOSIS — I10 Essential (primary) hypertension: Secondary | ICD-10-CM | POA: Diagnosis not present

## 2023-07-04 HISTORY — PX: COLONOSCOPY WITH PROPOFOL: SHX5780

## 2023-07-04 HISTORY — DX: Essential (primary) hypertension: I10

## 2023-07-04 HISTORY — PX: POLYPECTOMY: SHX149

## 2023-07-04 SURGERY — COLONOSCOPY WITH PROPOFOL
Anesthesia: General

## 2023-07-04 MED ORDER — PROPOFOL 10 MG/ML IV BOLUS
INTRAVENOUS | Status: DC | PRN
  Administered 2023-07-04: 40 mg via INTRAVENOUS
  Administered 2023-07-04: 180 mg via INTRAVENOUS

## 2023-07-04 MED ORDER — LACTATED RINGERS IV SOLN: INTRAVENOUS | Status: DC

## 2023-07-04 MED ORDER — PROPOFOL 500 MG/50ML IV EMUL
INTRAVENOUS | Status: DC | PRN
  Administered 2023-07-04: 125 ug/kg/min via INTRAVENOUS

## 2023-07-04 MED ORDER — STERILE WATER FOR IRRIGATION IR SOLN
Status: DC | PRN
  Administered 2023-07-04: 60 mL

## 2023-07-04 NOTE — Anesthesia Postprocedure Evaluation (Signed)
Anesthesia Post Note  Patient: Jeffery Cabrera  Procedure(s) Performed: COLONOSCOPY WITH PROPOFOL POLYPECTOMY INTESTINAL  Patient location during evaluation: Phase II Anesthesia Type: General Level of consciousness: awake and alert and oriented Pain management: pain level controlled Vital Signs Assessment: post-procedure vital signs reviewed and stable Respiratory status: spontaneous breathing, nonlabored ventilation and respiratory function stable Cardiovascular status: blood pressure returned to baseline and stable Postop Assessment: no apparent nausea or vomiting Anesthetic complications: no  No notable events documented.   Last Vitals:  Vitals:   07/04/23 0659 07/04/23 0820  BP: 138/89 (!) 114/53  Pulse: 79 83  Resp: 17   Temp: 37.1 C 36.6 C  SpO2: 97% 96%    Last Pain:  Vitals:   07/04/23 0820  TempSrc: Oral  PainSc: 0-No pain                 Lakashia Collison C Abbegail Matuska

## 2023-07-04 NOTE — H&P (Signed)
Jeffery Cabrera is an 57 y.o. male.   Chief Complaint: screening colonoscopy HPI: 57 y/o M with PMH HTN, coming for screening colonoscopy. The patient has never had a colonoscopy in the past.  The patient denies having any complaints such as melena, hematochezia, abdominal pain or distention, change in her bowel movement consistency or frequency, no changes in weight recently.  No family history of colorectal cancer.   Past Medical History:  Diagnosis Date   Hypertension    Pneumonia    Sleep apnea     History reviewed. No pertinent surgical history.  History reviewed. No pertinent family history. Social History:  reports that he has never smoked. He has never used smokeless tobacco. He reports that he does not currently use alcohol. He reports that he does not use drugs.  Allergies: No Known Allergies  Medications Prior to Admission  Medication Sig Dispense Refill   rosuvastatin (CRESTOR) 10 MG tablet Take 1 tablet (10 mg total) by mouth daily. 90 tablet 3   sildenafil (VIAGRA) 25 MG tablet Take 1 tablet (25 mg total) by mouth as needed for erectile dysfunction. 30 tablet 0   telmisartan (MICARDIS) 40 MG tablet Take 1 tablet (40 mg total) by mouth daily. 90 tablet 1   Vitamin D, Ergocalciferol, (DRISDOL) 1.25 MG (50000 UNIT) CAPS capsule TAKE 1 CAPSULE BY MOUTH EVERY 7 DAYS 5 capsule 1   baclofen (LIORESAL) 10 MG tablet Take 1 tablet (10 mg total) by mouth 3 (three) times daily. (Patient not taking: Reported on 06/24/2023) 30 each 0   gabapentin (NEURONTIN) 100 MG capsule Take 1 capsule (100 mg total) by mouth daily. 60 capsule 0   oxyCODONE-acetaminophen (PERCOCET/ROXICET) 5-325 MG tablet Take 1 tablet by mouth every 6 (six) hours as needed for severe pain. 15 tablet 0    No results found for this or any previous visit (from the past 48 hour(s)). No results found.  Review of Systems  All other systems reviewed and are negative.   Blood pressure 138/89, pulse 79, temperature  98.7 F (37.1 C), temperature source Oral, resp. rate 17, height 6\' 2"  (1.88 m), weight 110.2 kg, SpO2 97%. Physical Exam  GENERAL: The patient is AO x3, in no acute distress. HEENT: Head is normocephalic and atraumatic. EOMI are intact. Mouth is well hydrated and without lesions. NECK: Supple. No masses LUNGS: Clear to auscultation. No presence of rhonchi/wheezing/rales. Adequate chest expansion HEART: RRR, normal s1 and s2. ABDOMEN: Soft, nontender, no guarding, no peritoneal signs, and nondistended. BS +. No masses. EXTREMITIES: Without any cyanosis, clubbing, rash, lesions or edema. NEUROLOGIC: AOx3, no focal motor deficit. SKIN: no jaundice, no rashes  Assessment/Plan 57 y/o M with PMH HTN, coming for screening colonoscopy. The patient is at average risk for colorectal cancer.  We will proceed with colonoscopy today.   Jeffery Frame, MD 07/04/2023, 7:32 AM

## 2023-07-04 NOTE — Op Note (Signed)
[facility] Patient Name: Jeffery Cabrera Procedure Date: 07/04/2023 7:02 AM MRN: [id number] Date of Birth: 1965/11/08 Attending MD: Katrinka Blazing , , [id number] CSN: [id number] Age: 57 Admit Type: Outpatient Procedure:                Colonoscopy Indications:              Screening for colorectal malignant neoplasm Providers:                Katrinka Blazing, Crystal Page, Lennice Sites                            Technician, Technician Referring MD:              Medicines:                Monitored Anesthesia Care Complications:            No immediate complications. Estimated Blood Loss:     Estimated blood loss: none. Procedure:                Pre-Anesthesia Assessment:                           - Prior to the procedure, a History and Physical                            was performed, and patient medications, allergies                            and sensitivities were reviewed. The patient's                            tolerance of previous anesthesia was reviewed.                           - The risks and benefits of the procedure and the                            sedation options and risks were discussed with the                            patient. All questions were answered and informed                            consent was obtained.                           - ASA Grade Assessment: II - A patient with mild                            systemic disease.                           After obtaining informed consent, the colonoscope                            was passed under direct vision. Throughout the  procedure, the patient's blood pressure, pulse, and                            oxygen saturations were monitored continuously. The                            [id number]-[id number] ([id number]) scope was introduced through                            the anus and advanced to the the cecum, identified                            by appendiceal orifice and ileocecal  valve. The                            colonoscopy was performed without difficulty. The                            patient tolerated the procedure well. The quality                            of the bowel preparation was good. Scope In: 7:50:03 AM Scope Out: 8:15:32 AM Scope Withdrawal Time: 0 hours 19 minutes 15 seconds  Total Procedure Duration: 0 hours 25 minutes 29 seconds  Findings:      The perianal and digital rectal examinations were normal.      A 3 mm polyp was found in the transverse colon. The polyp was sessile.       The polyp was removed with a cold snare. Resection and retrieval were       complete.      Non-bleeding internal hemorrhoids were found during retroflexion. The       hemorrhoids were medium-sized. Impression:               - One 3 mm polyp in the transverse colon, removed                            with a cold snare. Resected and retrieved.                           - Non-bleeding internal hemorrhoids. Moderate Sedation:      Per Anesthesia Care Recommendation:           - Discharge patient to home (ambulatory).                           - Resume previous diet.                           - Await pathology results.                           - Repeat colonoscopy for surveillance based on                            pathology results. Procedure Code(s):        ---  Professional ---                           [phone], Colonoscopy, flexible; with removal of                            tumor(s), polyp(s), or other lesion(s) by snare                            technique Diagnosis Code(s):        --- Professional ---                           Z12.11, Encounter for screening for malignant                            neoplasm of colon                           D12.3, Benign neoplasm of transverse colon (hepatic                            flexure or splenic flexure)                           K64.8, Other hemorrhoids CPT copyright 2022 American Medical Association. All  rights reserved. The codes documented in this report are preliminary and upon coder review may  be revised to meet current compliance requirements. Katrinka Blazing, MD Katrinka Blazing,  07/04/2023 8:22:03 AM This report has been signed electronically. Number of Addenda: 0

## 2023-07-04 NOTE — Discharge Instructions (Addendum)
You are being discharged to home.  Resume your previous diet.  We are waiting for your pathology results.  Your physician has recommended a repeat colonoscopy for surveillance based on pathology results.  

## 2023-07-04 NOTE — Transfer of Care (Signed)
Immediate Anesthesia Transfer of Care Note  Patient: Jeffery Cabrera  Procedure(s) Performed: COLONOSCOPY WITH PROPOFOL POLYPECTOMY INTESTINAL  Patient Location: Endoscopy Unit  Anesthesia Type:General  Level of Consciousness: drowsy  Airway & Oxygen Therapy: Patient Spontanous Breathing  Post-op Assessment: Report given to RN and Post -op Vital signs reviewed and stable  Post vital signs: Reviewed and stable  Last Vitals:  Vitals Value Taken Time  BP 114/53 07/04/23 0820  Temp 36.6 C 07/04/23 0820  Pulse 83 07/04/23 0820  Resp 16 07/04/23  0821  SpO2 96 % 07/04/23 0820    Last Pain:  Vitals:   07/04/23 0820  TempSrc: Oral  PainSc: 0-No pain      Patients Stated Pain Goal: 2 (07/04/23 0659)  Complications: No notable events documented.

## 2023-07-04 NOTE — Anesthesia Preprocedure Evaluation (Addendum)
Anesthesia Evaluation  Patient identified by MRN, date of birth, ID band Patient awake    Reviewed: Allergy & Precautions, H&P , NPO status , Patient's Chart, lab work & pertinent test results  Airway Mallampati: III  TM Distance: >3 FB Neck ROM: Full   Comment: Neck pain Dental  (+) Dental Advisory Given, Missing,    Pulmonary sleep apnea and Continuous Positive Airway Pressure Ventilation , pneumonia, resolved   Pulmonary exam normal breath sounds clear to auscultation       Cardiovascular Exercise Tolerance: Good hypertension, Pt. on medications Normal cardiovascular exam Rhythm:Regular Rate:Normal     Neuro/Psych  Neuromuscular disease  negative psych ROS   GI/Hepatic negative GI ROS, Neg liver ROS,,,  Endo/Other  negative endocrine ROS    Renal/GU negative Renal ROS  negative genitourinary   Musculoskeletal negative musculoskeletal ROS (+)    Abdominal   Peds negative pediatric ROS (+)  Hematology negative hematology ROS (+)   Anesthesia Other Findings   Reproductive/Obstetrics negative OB ROS                             Anesthesia Physical Anesthesia Plan  ASA: 2  Anesthesia Plan: General   Post-op Pain Management: Minimal or no pain anticipated   Induction: Intravenous  PONV Risk Score and Plan: 1  Airway Management Planned: Nasal Cannula and Natural Airway  Additional Equipment:   Intra-op Plan:   Post-operative Plan:   Informed Consent: I have reviewed the patients History and Physical, chart, labs and discussed the procedure including the risks, benefits and alternatives for the proposed anesthesia with the patient or authorized representative who has indicated his/her understanding and acceptance.     Dental advisory given  Plan Discussed with: CRNA and Surgeon  Anesthesia Plan Comments:        Anesthesia Quick Evaluation

## 2023-07-04 NOTE — Anesthesia Procedure Notes (Signed)
Date/Time: 07/04/2023 7:44 AM  Performed by: Franco Nones, CRNAPre-anesthesia Checklist: Patient identified, Emergency Drugs available, Suction available, Timeout performed and Patient being monitored Patient Re-evaluated:Patient Re-evaluated prior to induction Oxygen Delivery Method: Nasal Cannula

## 2023-07-05 ENCOUNTER — Encounter (INDEPENDENT_AMBULATORY_CARE_PROVIDER_SITE_OTHER): Payer: Self-pay | Admitting: *Deleted

## 2023-07-05 LAB — HM COLONOSCOPY

## 2023-07-08 LAB — SURGICAL PATHOLOGY

## 2023-07-10 ENCOUNTER — Encounter (INDEPENDENT_AMBULATORY_CARE_PROVIDER_SITE_OTHER): Payer: Self-pay | Admitting: *Deleted

## 2023-07-12 ENCOUNTER — Encounter (HOSPITAL_COMMUNITY): Payer: Self-pay | Admitting: Gastroenterology

## 2023-08-06 ENCOUNTER — Telehealth: Payer: Self-pay | Admitting: Family Medicine

## 2023-08-06 NOTE — Telephone Encounter (Signed)
Spoke to pt advised for him to come in for lab work, orders are in from August , he reports feeling fatigued , sleepy at times, let him know once labs are back we will contact him with results and go from there. Malachi Bonds is aware.

## 2023-08-06 NOTE — Telephone Encounter (Signed)
Patient called left a voicemail asking for his provider to return his calls, he has concern questions to ask her. Call back# [phone].

## 2023-09-25 NOTE — Telephone Encounter (Signed)
Pt is calling in about the appt that had to be rescheduled there is no appt for dec 23 next appt for dr Denny Levy is pushing out to [place] pt would like a call back regarding this

## 2023-09-25 NOTE — Telephone Encounter (Signed)
Called patient left voicemail to contact our office.

## 2023-10-17 ENCOUNTER — Other Ambulatory Visit: Payer: Self-pay | Admitting: Family Medicine

## 2023-10-17 DIAGNOSIS — N529 Male erectile dysfunction, unspecified: Secondary | ICD-10-CM

## 2023-10-17 MED ORDER — SILDENAFIL CITRATE 25 MG PO TABS: 25.0000 mg | ORAL_TABLET | ORAL | 0 refills | Status: AC | PRN

## 2023-10-21 ENCOUNTER — Ambulatory Visit: Payer: 59 | Admitting: Family Medicine

## 2023-11-01 ENCOUNTER — Ambulatory Visit (INDEPENDENT_AMBULATORY_CARE_PROVIDER_SITE_OTHER): Payer: 59 | Admitting: Family Medicine

## 2023-11-01 ENCOUNTER — Encounter: Payer: Self-pay | Admitting: Family Medicine

## 2023-11-01 VITALS — BP 138/84 | HR 85 | Ht 74.0 in | Wt 245.0 lb

## 2023-11-01 DIAGNOSIS — N528 Other male erectile dysfunction: Secondary | ICD-10-CM | POA: Diagnosis not present

## 2023-11-01 DIAGNOSIS — R7301 Impaired fasting glucose: Secondary | ICD-10-CM | POA: Diagnosis not present

## 2023-11-01 DIAGNOSIS — E7849 Other hyperlipidemia: Secondary | ICD-10-CM

## 2023-11-01 DIAGNOSIS — E038 Other specified hypothyroidism: Secondary | ICD-10-CM

## 2023-11-01 DIAGNOSIS — I10 Essential (primary) hypertension: Secondary | ICD-10-CM | POA: Diagnosis not present

## 2023-11-01 DIAGNOSIS — E559 Vitamin D deficiency, unspecified: Secondary | ICD-10-CM

## 2023-11-01 DIAGNOSIS — E785 Hyperlipidemia, unspecified: Secondary | ICD-10-CM

## 2023-11-01 NOTE — Assessment & Plan Note (Addendum)
 The patient reports that his prescription was refilled a few weeks ago

## 2023-11-01 NOTE — Assessment & Plan Note (Signed)
 The patient's condition is currently controlled and asymptomatic.  He is taking Telmisartan  40 mg daily and reports treatment compliance.  The patient reports working out four days a week and adhering to a low-sodium diet. Encouraged to continue with the low-sodium diet and increased physical activity.  BP Readings from Last 3 Encounters:  11/01/23 138/84  07/04/23 (!) 114/53  06/24/23 130/72

## 2023-11-01 NOTE — Patient Instructions (Signed)
I appreciate the opportunity to provide care to you today!    Follow up:  4 months  Labs: please stop by the lab today to get your blood drawn (CBC, CMP, TSH, Lipid profile, HgA1c, Vit D)   Please continue to a heart-healthy diet and increase your physical activities. Try to exercise for at least five days a week.    It was a pleasure to see you and I look forward to continuing to work together on your health and well-being. Please do not hesitate to call the office if you need care or have questions about your care.  In case of emergency, please visit the Emergency Department for urgent care, or contact our clinic at [phone] to schedule an appointment. We're here to help you!   Have a wonderful day and week. With Gratitude, Gilmore Laroche MSN, FNP-BC

## 2023-11-01 NOTE — Progress Notes (Signed)
 Established Patient Office Visit  Subjective:  Patient ID: Jeffery Cabrera, male    DOB: May 25, 1966  Age: 58 y.o. MRN: [id number]  CC:  Chief Complaint  Patient presents with   Care Management    4 month f/u    HPI Jeffery Cabrera is a 58 y.o. male with past medical history of essential hypertension, erectile dysfunction, hyperlipidemia presents for f/u of  chronic medical conditions. For the details of today's visit, please refer to the assessment and plan.     Past Medical History:  Diagnosis Date   Hypertension    Pneumonia    Sleep apnea     Past Surgical History:  Procedure Laterality Date   COLONOSCOPY WITH PROPOFOL  N/A 07/04/2023   Procedure: COLONOSCOPY WITH PROPOFOL ;  Surgeon: Eartha Angelia Sieving, MD;  Location: [facility];  Service: Gastroenterology;  Laterality: N/A;  7:30AM;ASA 1   POLYPECTOMY  07/04/2023   Procedure: POLYPECTOMY INTESTINAL;  Surgeon: Eartha Angelia Sieving, MD;  Location: [facility];  Service: Gastroenterology;;    History reviewed. No pertinent family history.  Social History   Socioeconomic History   Marital status: Single    Spouse name: Not on file   Number of children: 2   Years of education: Not on file   Highest education level: Associate degree: occupational, scientist, product/process development, or vocational program  Occupational History   Occupation: public house manager.  Tobacco Use   Smoking status: Never   Smokeless tobacco: Never  Vaping Use   Vaping status: Never Used  Substance and Sexual Activity   Alcohol use: Not Currently   Drug use: No   Sexual activity: Yes  Other Topics Concern   Not on file  Social History Narrative   Not on file   Social Drivers of Health   Financial Resource Strain: Medium Risk (10/31/2023)   Overall Financial Resource Strain (CARDIA)    Difficulty of Paying Living Expenses: Somewhat hard  Food Insecurity: No Food Insecurity (10/31/2023)   Hunger Vital Sign    Worried About Running Out of  Food in the Last Year: Never true    Ran Out of Food in the Last Year: Never true  Transportation Needs: No Transportation Needs (10/31/2023)   PRAPARE - Administrator, Civil Service (Medical): No    Lack of Transportation (Non-Medical): No  Physical Activity: Not on file  Stress: Not on file  Social Connections: Moderately Integrated (10/31/2023)   Social Connection and Isolation Panel [NHANES]    Frequency of Communication with Friends and Family: More than three times a week    Frequency of Social Gatherings with Friends and Family: Once a week    Attends Religious Services: More than 4 times per year    Active Member of [employer] or Organizations: Yes    Attends Engineer, Structural: More than 4 times per year    Marital Status: Divorced  Catering Manager Violence: Not on file    Outpatient Medications Prior to Visit  Medication Sig Dispense Refill   baclofen  (LIORESAL ) 10 MG tablet Take 1 tablet (10 mg total) by mouth 3 (three) times daily. 30 each 0   gabapentin  (NEURONTIN ) 100 MG capsule Take 1 capsule (100 mg total) by mouth daily. 60 capsule 0   oxyCODONE -acetaminophen  (PERCOCET/ROXICET) 5-325 MG tablet Take 1 tablet by mouth every 6 (six) hours as needed for severe pain. 15 tablet 0   rosuvastatin  (CRESTOR ) 10 MG tablet Take 1 tablet (10 mg total) by mouth daily.  90 tablet 3   sildenafil  (VIAGRA ) 25 MG tablet Take 1 tablet (25 mg total) by mouth as needed for erectile dysfunction. 30 tablet 0   telmisartan  (MICARDIS ) 40 MG tablet Take 1 tablet (40 mg total) by mouth daily. 90 tablet 1   Vitamin D , Ergocalciferol , (DRISDOL ) 1.25 MG (50000 UNIT) CAPS capsule TAKE 1 CAPSULE BY MOUTH EVERY 7 DAYS 5 capsule 1   No facility-administered medications prior to visit.    No Known Allergies  ROS Review of Systems  Constitutional:  Negative for fatigue and fever.  Eyes:  Negative for visual disturbance.  Respiratory:  Negative for chest tightness and shortness of  breath.   Cardiovascular:  Negative for chest pain and palpitations.  Neurological:  Negative for dizziness and headaches.      Objective:    Physical Exam HENT:     Head: Normocephalic.     Right Ear: External ear normal.     Left Ear: External ear normal.     Nose: No congestion or rhinorrhea.     Mouth/Throat:     Mouth: Mucous membranes are moist.  Cardiovascular:     Rate and Rhythm: Regular rhythm.     Heart sounds: No murmur heard. Pulmonary:     Effort: No respiratory distress.     Breath sounds: Normal breath sounds.  Neurological:     Mental Status: He is alert.     BP 138/84 (BP Location: Left Arm)   Pulse 85   Ht 6' 2 (1.88 m)   Wt 245 lb 0.6 oz (111.1 kg)   SpO2 94%   BMI 31.46 kg/m  Wt Readings from Last 3 Encounters:  11/01/23 245 lb 0.6 oz (111.1 kg)  07/04/23 243 lb (110.2 kg)  06/24/23 239 lb (108.4 kg)    Lab Results  Component Value Date   TSH 1.900 04/02/2022   Lab Results  Component Value Date   WBC 6.9 04/02/2022   HGB 14.3 04/02/2022   HCT 41.2 04/02/2022   MCV 98 (H) 04/02/2022   PLT 303 04/02/2022   Lab Results  Component Value Date   NA 144 04/02/2022   K 4.5 04/02/2022   CO2 22 04/02/2022   GLUCOSE 121 (H) 04/02/2022   BUN 13 04/02/2022   CREATININE 1.24 04/02/2022   BILITOT 0.5 04/02/2022   ALKPHOS 66 04/02/2022   AST 22 04/02/2022   ALT 23 04/02/2022   PROT 7.2 04/02/2022   ALBUMIN 4.3 04/02/2022   CALCIUM  9.3 04/02/2022   ANIONGAP 10 03/31/2020   EGFR 70 04/02/2022   Lab Results  Component Value Date   CHOL 195 04/02/2022   Lab Results  Component Value Date   HDL 35 (L) 04/02/2022   Lab Results  Component Value Date   LDLCALC 146 (H) 04/02/2022   Lab Results  Component Value Date   TRIG 74 04/02/2022   Lab Results  Component Value Date   CHOLHDL 5.6 (H) 04/02/2022   Lab Results  Component Value Date   HGBA1C 5.6 04/02/2022      Assessment & Plan:  Essential hypertension Assessment &  Plan: The patient's condition is currently controlled and asymptomatic.  He is taking Telmisartan  40 mg daily and reports treatment compliance.  The patient reports working out four days a week and adhering to a low-sodium diet. Encouraged to continue with the low-sodium diet and increased physical activity.  BP Readings from Last 3 Encounters:  11/01/23 138/84  07/04/23 (!) 114/53  06/24/23 130/72  Hyperlipidemia LDL goal <100 Assessment & Plan: Encouraged to continue taking rosuvastatin  10 mg daily The patient was encouraged to make lifestyle changes, including avoiding simple carbohydrates such as cakes, sweet desserts, ice cream, soda (diet or regular), sweet tea, candies, chips, cookies, store-bought juices, excessive alcohol (more than 1-2 drinks per day), lemonade, artificial sweeteners, donuts, coffee creamers, and sugar-free products. Additionally, reducing the consumption of greasy, fatty foods and increasing physical activity were recommended. The patient verbalized understanding and is aware of the plan of care. Lab Results  Component Value Date   CHOL 195 04/02/2022   HDL 35 (L) 04/02/2022   LDLCALC 146 (H) 04/02/2022   TRIG 74 04/02/2022   CHOLHDL 5.6 (H) 04/02/2022       Other male erectile dysfunction Assessment & Plan: The patient reports that his prescription was refilled a few weeks ago    IFG (impaired fasting glucose) -     Hemoglobin A1c  Vitamin D  deficiency -     VITAMIN D  25 Hydroxy (Vit-D Deficiency, Fractures)  TSH (thyroid-stimulating hormone deficiency) -     TSH + free T4  Other hyperlipidemia -     Lipid panel -     CMP14+EGFR -     CBC with Differential/Platelet  Note: This chart has been completed using Engineer, Civil (consulting) software, and while attempts have been made to ensure accuracy, certain words and phrases may not be transcribed as intended.    Follow-up: Return in about 4 months (around 02/29/2024).   Lynore Coscia,  FNP

## 2023-11-01 NOTE — Assessment & Plan Note (Addendum)
 Encouraged to continue taking rosuvastatin  10 mg daily The patient was encouraged to make lifestyle changes, including avoiding simple carbohydrates such as cakes, sweet desserts, ice cream, soda (diet or regular), sweet tea, candies, chips, cookies, store-bought juices, excessive alcohol (more than 1-2 drinks per day), lemonade, artificial sweeteners, donuts, coffee creamers, and sugar-free products. Additionally, reducing the consumption of greasy, fatty foods and increasing physical activity were recommended. The patient verbalized understanding and is aware of the plan of care. Lab Results  Component Value Date   CHOL 195 04/02/2022   HDL 35 (L) 04/02/2022   LDLCALC 146 (H) 04/02/2022   TRIG 74 04/02/2022   CHOLHDL 5.6 (H) 04/02/2022

## 2024-04-16 ENCOUNTER — Encounter: Payer: Self-pay | Admitting: Emergency Medicine

## 2024-04-16 ENCOUNTER — Ambulatory Visit: Payer: Self-pay | Admitting: *Deleted

## 2024-04-16 ENCOUNTER — Ambulatory Visit
Admission: EM | Admit: 2024-04-16 | Discharge: 2024-04-16 | Disposition: A | Discharge status: Home / Self Care | Attending: Family Medicine | Admitting: Family Medicine

## 2024-04-16 ENCOUNTER — Other Ambulatory Visit: Payer: Self-pay

## 2024-04-16 DIAGNOSIS — J4521 Mild intermittent asthma with (acute) exacerbation: Secondary | ICD-10-CM

## 2024-04-16 DIAGNOSIS — J069 Acute upper respiratory infection, unspecified: Secondary | ICD-10-CM

## 2024-04-16 HISTORY — DX: Acute bronchospasm: J98.01

## 2024-04-16 MED ORDER — ALBUTEROL SULFATE HFA 108 (90 BASE) MCG/ACT IN AERS: 2.0000 | INHALATION_SPRAY | RESPIRATORY_TRACT | 0 refills | Status: AC | PRN

## 2024-04-16 MED ORDER — ALBUTEROL SULFATE (2.5 MG/3ML) 0.083% IN NEBU
2.5000 mg | INHALATION_SOLUTION | Freq: Once | RESPIRATORY_TRACT | Status: AC
  Administered 2024-04-16: 2.5 mg via RESPIRATORY_TRACT

## 2024-04-16 MED ORDER — PROMETHAZINE-DM 6.25-15 MG/5ML PO SYRP: 5.0000 mL | ORAL_SOLUTION | Freq: Four times a day (QID) | ORAL | 0 refills | Status: AC | PRN

## 2024-04-16 MED ORDER — PREDNISONE 20 MG PO TABS: 40.0000 mg | ORAL_TABLET | Freq: Every day | ORAL | 0 refills | Status: AC

## 2024-04-16 NOTE — ED Provider Notes (Signed)
 [facility]    CSN: [id number] Arrival date & time: 04/16/24  1426      History   Chief Complaint Chief Complaint  Patient presents with   Cough    HPI Jeffery Cabrera is a 58 y.o. male.   Patient presenting today with 3-day history of cough, shortness of breath, wheezing, congestion.  Denies fever, chills, chest pain, severe shortness of breath, abdominal pain, vomiting, diarrhea.  So far trying over-the-counter cough syrups with minimal relief.  History of asthma not currently on an albuterol  inhaler.    Past Medical History:  Diagnosis Date   Bronchial spasms    Hypertension    Pneumonia    Sleep apnea     Patient Active Problem List   Diagnosis Date Noted   Hyperlipidemia LDL goal <100 11/01/2023   Encounter for screening colonoscopy 07/04/2023   Visual disturbance 06/24/2023   Neuropathy 02/22/2023   Cervical pain (neck) 11/22/2022   Essential hypertension 07/23/2022   Need for viral immunization 07/23/2022   Numbness and tingling sensation of skin 03/23/2022   Sleep apnea 03/22/2022   Erectile dysfunction 03/22/2022   Reactive airway disease with wheezing 05/30/2020    Past Surgical History:  Procedure Laterality Date   COLONOSCOPY WITH PROPOFOL  N/A 07/04/2023   Procedure: COLONOSCOPY WITH PROPOFOL ;  Surgeon: Urban Garden, MD;  Location: [facility];  Service: Gastroenterology;  Laterality: N/A;  7:30AM;ASA 1   POLYPECTOMY  07/04/2023   Procedure: POLYPECTOMY INTESTINAL;  Surgeon: Urban Garden, MD;  Location: [facility];  Service: Gastroenterology;;       Home Medications    Prior to Admission medications   Medication Sig Start Date End Date Taking? Authorizing Provider  albuterol  (VENTOLIN  HFA) 108 (90 Base) MCG/ACT inhaler Inhale 2 puffs into the lungs every 4 (four) hours as needed. 04/16/24  Yes Corbin Dess, PA-C  predniSONE  (DELTASONE ) 20 MG tablet Take 2 tablets (40 mg total) by mouth daily  with breakfast. 04/16/24  Yes Corbin Dess, PA-C  promethazine-dextromethorphan (PROMETHAZINE-DM) 6.25-15 MG/5ML syrup Take 5 mLs by mouth 4 (four) times daily as needed. 04/16/24  Yes Corbin Dess, PA-C  baclofen  (LIORESAL ) 10 MG tablet Take 1 tablet (10 mg total) by mouth 3 (three) times daily. 11/21/22   Zarwolo, Gloria, FNP  gabapentin  (NEURONTIN ) 100 MG capsule Take 1 capsule (100 mg total) by mouth daily. 02/22/23   Zarwolo, Gloria, FNP  oxyCODONE -acetaminophen  (PERCOCET/ROXICET) 5-325 MG tablet Take 1 tablet by mouth every 6 (six) hours as needed for severe pain. 12/20/22   Zarwolo, Gloria, FNP  rosuvastatin  (CRESTOR ) 10 MG tablet Take 1 tablet (10 mg total) by mouth daily. 04/04/22   Zarwolo, Gloria, FNP  sildenafil  (VIAGRA ) 25 MG tablet Take 1 tablet (25 mg total) by mouth as needed for erectile dysfunction. Patient not taking: Reported on 04/16/2024 10/17/23   Zarwolo, Gloria, FNP  telmisartan  (MICARDIS ) 40 MG tablet Take 1 tablet (40 mg total) by mouth daily. 06/24/23   Zarwolo, Gloria, FNP  Vitamin D , Ergocalciferol , (DRISDOL ) 1.25 MG (50000 UNIT) CAPS capsule TAKE 1 CAPSULE BY MOUTH EVERY 7 DAYS 06/11/22   Zarwolo, Gloria, FNP    Family History History reviewed. No pertinent family history.  Social History Social History   Tobacco Use   Smoking status: Never   Smokeless tobacco: Never  Vaping Use   Vaping status: Never Used  Substance Use Topics   Alcohol use: Not Currently   Drug use: No     Allergies  Patient has no known allergies.   Review of Systems Review of Systems Per HPI  Physical Exam Triage Vital Signs ED Triage Vitals [04/16/24 1435]  Encounter Vitals Group     BP 119/75     Girls Systolic BP Percentile      Girls Diastolic BP Percentile      Boys Systolic BP Percentile      Boys Diastolic BP Percentile      Pulse Rate 98     Resp 20     Temp 98.7 F (37.1 C)     Temp Source Oral     SpO2 94 %     Weight      Height      Head  Circumference      Peak Flow      Pain Score 2     Pain Loc      Pain Education      Exclude from Growth Chart    No data found.  Updated Vital Signs BP 119/75 (BP Location: Right Arm)   Pulse 98   Temp 98.7 F (37.1 C) (Oral)   Resp 20   SpO2 94%   Visual Acuity Right Eye Distance:   Left Eye Distance:   Bilateral Distance:    Right Eye Near:   Left Eye Near:    Bilateral Near:     Physical Exam Vitals and nursing note reviewed.  Constitutional:      Appearance: He is well-developed.  HENT:     Head: Atraumatic.     Right Ear: External ear normal.     Left Ear: External ear normal.     Nose: Rhinorrhea present.     Mouth/Throat:     Pharynx: No oropharyngeal exudate or posterior oropharyngeal erythema.   Eyes:     Conjunctiva/sclera: Conjunctivae normal.     Pupils: Pupils are equal, round, and reactive to light.    Cardiovascular:     Rate and Rhythm: Normal rate and regular rhythm.  Pulmonary:     Effort: Pulmonary effort is normal. No respiratory distress.     Breath sounds: Wheezing present. No rales.   Musculoskeletal:        General: Normal range of motion.     Cervical back: Normal range of motion and neck supple.  Lymphadenopathy:     Cervical: No cervical adenopathy.   Skin:    General: Skin is warm and dry.   Neurological:     Mental Status: He is alert and oriented to person, place, and time.   Psychiatric:        Behavior: Behavior normal.      UC Treatments / Results  Labs (all labs ordered are listed, but only abnormal results are displayed) Labs Reviewed - No data to display  EKG   Radiology No results found.  Procedures Procedures (including critical care time)  Medications Ordered in UC Medications  albuterol  (PROVENTIL ) (2.5 MG/3ML) 0.083% nebulizer solution 2.5 mg (2.5 mg Nebulization Given 04/16/24 1501)    Initial Impression / Assessment and Plan / UC Course  I have reviewed the triage vital signs and the  nursing notes.  Pertinent labs & imaging results that were available during my care of the patient were reviewed by me and considered in my medical decision making (see chart for details).     Vitals and exam very reassuring today, suspect viral respiratory infection causing asthma exacerbation.  Albuterol  nebulizer treatment given in clinic prior to discharge, will treat with  prednisone , albuterol , Phenergan DM, supportive over-the-counter medications and home care.  Return for worsening symptoms.  Final Clinical Impressions(s) / UC Diagnoses   Final diagnoses:  Viral URI with cough  Mild intermittent asthma with acute exacerbation   Discharge Instructions   None    ED Prescriptions     Medication Sig Dispense Auth. Provider   predniSONE  (DELTASONE ) 20 MG tablet Take 2 tablets (40 mg total) by mouth daily with breakfast. 10 tablet Corbin Dess, PA-C   albuterol  (VENTOLIN  HFA) 108 (90 Base) MCG/ACT inhaler Inhale 2 puffs into the lungs every 4 (four) hours as needed. 18 g Tacy [place], PA-C   promethazine-dextromethorphan (PROMETHAZINE-DM) 6.25-15 MG/5ML syrup Take 5 mLs by mouth 4 (four) times daily as needed. 100 mL Corbin Dess, [place]      PDMP not reviewed this encounter.   Corbin Dess, [place] 04/16/24 [phone]

## 2024-04-16 NOTE — Telephone Encounter (Signed)
Patient advised to go to urgent care

## 2024-04-16 NOTE — ED Triage Notes (Signed)
 Pt reports cough, shortness of breath that has progressively gotten worse in the last few days. Pt alert and oriented. Able to speak clear complete sentences. NAD noted.

## 2024-04-16 NOTE — Telephone Encounter (Signed)
                         FYI Only or Action Required?: FYI only for provider.  Patient was last seen in primary care on 11/01/2023 by Zarwolo, Gloria, FNP. Called Nurse Triage reporting Cough. Symptoms began several days ago. Interventions attempted: OTC medications: warm liquids, robitussin. Symptoms are: unchanged.  Triage Disposition: See HCP Within 4 Hours (Or PCP Triage)  Patient/caregiver understands and will follow disposition?: Yes                  Copied from CRM [id number]. Topic: Clinical - Red Word Triage >> Apr 16, 2024  8:26 AM Marissa P wrote: Red Word that prompted transfer to Nurse Triage: Patient is having shortness of breathe and coughing really hard. Pressure and a bit of tightness in chest. Patient would like to be seen today if possible please. Reason for Disposition  [1] MILD difficulty breathing (e.g., minimal/no SOB at rest, SOB with walking, pulse <100) AND [2] still present when not coughing  Answer Assessment - Initial Assessment Questions 1. ONSET: When did the cough begin?      Couple of days ago  2. SEVERITY: How bad is the cough today?      Getting worse  3. SPUTUM: Describe the color of your sputum (none, dry cough; clear, white, yellow, green)     clear 4. HEMOPTYSIS: Are you coughing up any blood? If so ask: How much? (flecks, streaks, tablespoons, etc.)     na 5. DIFFICULTY BREATHING: Are you having difficulty breathing? If Yes, ask: How bad is it? (e.g., mild, moderate, severe)    - MILD: No SOB at rest, mild SOB with walking, speaks normally in sentences, can lie down, no retractions, pulse < 100.    - MODERATE: SOB at rest, SOB with minimal exertion and prefers to sit, cannot lie down flat, speaks in phrases, mild retractions, audible wheezing, pulse 100-120.    - SEVERE: Very SOB at rest, speaks in single words, struggling to breathe, sitting hunched forward, retractions, pulse > 120      SOB after  coughing  6. FEVER: Do you have a fever? If Yes, ask: What is your temperature, how was it measured, and when did it start?     na 7. CARDIAC HISTORY: Do you have any history of heart disease? (e.g., heart attack, congestive heart failure)      na 8. LUNG HISTORY: Do you have any history of lung disease?  (e.g., pulmonary embolus, asthma, emphysema)     Hx  9. PE RISK FACTORS: Do you have a history of blood clots? (or: recent major surgery, recent prolonged travel, bedridden)     na 10. OTHER SYMPTOMS: Do you have any other symptoms? (e.g., runny nose, wheezing, chest pain)       Chest tightness at times SOB with coughing hard  11. PREGNANCY: Is there any chance you are pregnant? When was your last menstrual period?       na 12. TRAVEL: Have you traveled out of the country in the last month? (e.g., travel history, exposures)       na  Protocols used: Cough - Acute Productive-A-AH

## 2024-08-12 ENCOUNTER — Encounter (INDEPENDENT_AMBULATORY_CARE_PROVIDER_SITE_OTHER): Payer: Self-pay | Admitting: Gastroenterology
